# Patient Record
Sex: Male | Born: 1955 | Race: White | Hispanic: No | Marital: Married | Smoking: Former smoker
Health system: Southern US, Community
[De-identification: ages and names within clinical notes are randomized; demographics above are authoritative.]

## PROBLEM LIST (undated history)

## (undated) DIAGNOSIS — K219 Gastro-esophageal reflux disease without esophagitis: Secondary | ICD-10-CM

## (undated) DIAGNOSIS — S93699A Other sprain of unspecified foot, initial encounter: Secondary | ICD-10-CM

## (undated) DIAGNOSIS — M810 Age-related osteoporosis without current pathological fracture: Secondary | ICD-10-CM

## (undated) DIAGNOSIS — Z8739 Personal history of other diseases of the musculoskeletal system and connective tissue: Secondary | ICD-10-CM

## (undated) DIAGNOSIS — F1020 Alcohol dependence, uncomplicated: Secondary | ICD-10-CM

## (undated) DIAGNOSIS — G4730 Sleep apnea, unspecified: Secondary | ICD-10-CM

## (undated) DIAGNOSIS — K760 Fatty (change of) liver, not elsewhere classified: Secondary | ICD-10-CM

## (undated) DIAGNOSIS — H409 Unspecified glaucoma: Secondary | ICD-10-CM

## (undated) DIAGNOSIS — F419 Anxiety disorder, unspecified: Secondary | ICD-10-CM

## (undated) DIAGNOSIS — K8020 Calculus of gallbladder without cholecystitis without obstruction: Secondary | ICD-10-CM

## (undated) DIAGNOSIS — F329 Major depressive disorder, single episode, unspecified: Secondary | ICD-10-CM

## (undated) DIAGNOSIS — E669 Obesity, unspecified: Secondary | ICD-10-CM

## (undated) DIAGNOSIS — Z87828 Personal history of other (healed) physical injury and trauma: Secondary | ICD-10-CM

## (undated) DIAGNOSIS — Z8709 Personal history of other diseases of the respiratory system: Secondary | ICD-10-CM

## (undated) DIAGNOSIS — F32A Depression, unspecified: Secondary | ICD-10-CM

## (undated) HISTORY — DX: Obesity, unspecified: E66.9

## (undated) HISTORY — DX: Major depressive disorder, single episode, unspecified: F32.9

## (undated) HISTORY — DX: Sleep apnea, unspecified: G47.30

## (undated) HISTORY — DX: Age-related osteoporosis without current pathological fracture: M81.0

## (undated) HISTORY — DX: Personal history of other (healed) physical injury and trauma: Z87.828

## (undated) HISTORY — DX: Depression, unspecified: F32.A

## (undated) HISTORY — DX: Gastro-esophageal reflux disease without esophagitis: K21.9

## (undated) HISTORY — DX: Anxiety disorder, unspecified: F41.9

## (undated) HISTORY — DX: Other sprain of unspecified foot, initial encounter: S93.699A

## (undated) HISTORY — DX: Calculus of gallbladder without cholecystitis without obstruction: K80.20

## (undated) HISTORY — PX: SPINE SURGERY: SHX786

## (undated) HISTORY — PX: KNEE ARTHROSCOPY: SUR90

## (undated) HISTORY — DX: Personal history of other diseases of the respiratory system: Z87.09

## (undated) HISTORY — PX: VENTRAL HERNIA REPAIR: SHX424

## (undated) HISTORY — DX: Unspecified glaucoma: H40.9

## (undated) HISTORY — PX: OTHER SURGICAL HISTORY: SHX169

## (undated) HISTORY — DX: Personal history of other diseases of the musculoskeletal system and connective tissue: Z87.39

## (undated) HISTORY — DX: Alcohol dependence, uncomplicated: F10.20

---

## 1981-08-10 HISTORY — PX: EXPLORATORY LAPAROTOMY: SUR591

## 1990-08-10 HISTORY — PX: FOOT SURGERY: SHX648

## 2000-04-05 ENCOUNTER — Observation Stay (HOSPITAL_COMMUNITY): Admission: RE | Admit: 2000-04-05 | Discharge: 2000-04-06 | Payer: Self-pay | Admitting: Surgery

## 2000-08-10 HISTORY — PX: OTHER SURGICAL HISTORY: SHX169

## 2000-08-10 HISTORY — PX: ANTERIOR CERVICAL DISCECTOMY: SHX1160

## 2000-12-03 ENCOUNTER — Encounter: Payer: Self-pay | Admitting: Neurosurgery

## 2000-12-03 ENCOUNTER — Observation Stay (HOSPITAL_COMMUNITY): Admission: RE | Admit: 2000-12-03 | Discharge: 2000-12-04 | Payer: Self-pay | Admitting: Neurosurgery

## 2000-12-20 ENCOUNTER — Ambulatory Visit (HOSPITAL_COMMUNITY): Admission: RE | Admit: 2000-12-20 | Discharge: 2000-12-21 | Payer: Self-pay | Admitting: Neurosurgery

## 2000-12-20 ENCOUNTER — Encounter: Payer: Self-pay | Admitting: Neurosurgery

## 2000-12-22 ENCOUNTER — Inpatient Hospital Stay (HOSPITAL_COMMUNITY): Admission: AD | Admit: 2000-12-22 | Discharge: 2000-12-24 | Payer: Self-pay | Admitting: Neurosurgery

## 2000-12-22 ENCOUNTER — Encounter: Payer: Self-pay | Admitting: Neurosurgery

## 2001-01-17 ENCOUNTER — Encounter: Payer: Self-pay | Admitting: Neurosurgery

## 2001-01-17 ENCOUNTER — Encounter: Admission: RE | Admit: 2001-01-17 | Discharge: 2001-01-17 | Payer: Self-pay | Admitting: Neurosurgery

## 2001-03-03 ENCOUNTER — Encounter: Admission: RE | Admit: 2001-03-03 | Discharge: 2001-03-03 | Payer: Self-pay | Admitting: Neurosurgery

## 2001-03-03 ENCOUNTER — Encounter: Payer: Self-pay | Admitting: Neurosurgery

## 2004-07-02 ENCOUNTER — Ambulatory Visit (HOSPITAL_BASED_OUTPATIENT_CLINIC_OR_DEPARTMENT_OTHER): Admission: RE | Admit: 2004-07-02 | Discharge: 2004-07-02 | Payer: Self-pay | Admitting: Orthopedic Surgery

## 2004-09-08 ENCOUNTER — Ambulatory Visit: Payer: Self-pay | Admitting: Internal Medicine

## 2004-09-11 ENCOUNTER — Ambulatory Visit: Payer: Self-pay | Admitting: Internal Medicine

## 2006-09-06 ENCOUNTER — Ambulatory Visit: Payer: Self-pay | Admitting: Internal Medicine

## 2006-11-25 ENCOUNTER — Emergency Department (HOSPITAL_COMMUNITY): Admission: EM | Admit: 2006-11-25 | Discharge: 2006-11-26 | Payer: Self-pay | Admitting: Emergency Medicine

## 2007-04-21 ENCOUNTER — Ambulatory Visit: Payer: Self-pay | Admitting: Internal Medicine

## 2007-04-21 DIAGNOSIS — J209 Acute bronchitis, unspecified: Secondary | ICD-10-CM | POA: Insufficient documentation

## 2007-08-25 ENCOUNTER — Ambulatory Visit: Payer: Self-pay | Admitting: Internal Medicine

## 2007-08-25 DIAGNOSIS — J328 Other chronic sinusitis: Secondary | ICD-10-CM | POA: Insufficient documentation

## 2007-08-25 DIAGNOSIS — G4710 Hypersomnia, unspecified: Secondary | ICD-10-CM

## 2007-08-25 DIAGNOSIS — G4730 Sleep apnea, unspecified: Secondary | ICD-10-CM

## 2007-08-25 DIAGNOSIS — M12579 Traumatic arthropathy, unspecified ankle and foot: Secondary | ICD-10-CM | POA: Insufficient documentation

## 2007-09-27 ENCOUNTER — Telehealth: Payer: Self-pay | Admitting: Internal Medicine

## 2007-09-27 ENCOUNTER — Ambulatory Visit: Payer: Self-pay | Admitting: Internal Medicine

## 2007-09-27 DIAGNOSIS — H6120 Impacted cerumen, unspecified ear: Secondary | ICD-10-CM

## 2007-10-25 ENCOUNTER — Ambulatory Visit: Payer: Self-pay | Admitting: Internal Medicine

## 2007-10-25 DIAGNOSIS — L909 Atrophic disorder of skin, unspecified: Secondary | ICD-10-CM | POA: Insufficient documentation

## 2007-10-25 DIAGNOSIS — L919 Hypertrophic disorder of the skin, unspecified: Secondary | ICD-10-CM

## 2007-11-03 ENCOUNTER — Telehealth: Payer: Self-pay | Admitting: Internal Medicine

## 2007-11-03 DIAGNOSIS — H6690 Otitis media, unspecified, unspecified ear: Secondary | ICD-10-CM

## 2007-11-03 HISTORY — DX: Otitis media, unspecified, unspecified ear: H66.90

## 2008-02-01 ENCOUNTER — Telehealth: Payer: Self-pay | Admitting: Internal Medicine

## 2008-02-02 ENCOUNTER — Ambulatory Visit: Payer: Self-pay | Admitting: Internal Medicine

## 2008-02-02 DIAGNOSIS — K439 Ventral hernia without obstruction or gangrene: Secondary | ICD-10-CM | POA: Insufficient documentation

## 2008-02-06 DIAGNOSIS — K439 Ventral hernia without obstruction or gangrene: Secondary | ICD-10-CM

## 2008-02-06 HISTORY — DX: Ventral hernia without obstruction or gangrene: K43.9

## 2008-09-25 ENCOUNTER — Encounter: Payer: Self-pay | Admitting: Internal Medicine

## 2009-12-05 ENCOUNTER — Ambulatory Visit: Payer: Self-pay | Admitting: Family Medicine

## 2009-12-05 DIAGNOSIS — M1310 Monoarthritis, not elsewhere classified, unspecified site: Secondary | ICD-10-CM | POA: Insufficient documentation

## 2009-12-06 LAB — CONVERTED CEMR LAB
Basophils Absolute: 0 10*3/uL (ref 0.0–0.1)
Basophils Relative: 0.3 % (ref 0.0–3.0)
Eosinophils Absolute: 0.2 10*3/uL (ref 0.0–0.7)
Eosinophils Relative: 1.4 % (ref 0.0–5.0)
HCT: 41.1 % (ref 39.0–52.0)
Hemoglobin: 14.3 g/dL (ref 13.0–17.0)
Lymphocytes Relative: 29.5 % (ref 12.0–46.0)
Lymphs Abs: 3.3 10*3/uL (ref 0.7–4.0)
MCHC: 34.7 g/dL (ref 30.0–36.0)
MCV: 92.3 fL (ref 78.0–100.0)
Monocytes Absolute: 1.1 10*3/uL — ABNORMAL HIGH (ref 0.1–1.0)
Monocytes Relative: 9.6 % (ref 3.0–12.0)
Neutro Abs: 6.6 10*3/uL (ref 1.4–7.7)
Neutrophils Relative %: 59.2 % (ref 43.0–77.0)
Platelets: 275 10*3/uL (ref 150.0–400.0)
RBC: 4.45 M/uL (ref 4.22–5.81)
RDW: 12.6 % (ref 11.5–14.6)
Uric Acid, Serum: 7.8 mg/dL (ref 4.0–7.8)
WBC: 11.2 10*3/uL — ABNORMAL HIGH (ref 4.5–10.5)

## 2009-12-11 ENCOUNTER — Telehealth: Payer: Self-pay | Admitting: Internal Medicine

## 2009-12-25 ENCOUNTER — Ambulatory Visit: Payer: Self-pay | Admitting: Internal Medicine

## 2009-12-25 DIAGNOSIS — M1A9XX0 Chronic gout, unspecified, without tophus (tophi): Secondary | ICD-10-CM

## 2009-12-25 DIAGNOSIS — E790 Hyperuricemia without signs of inflammatory arthritis and tophaceous disease: Secondary | ICD-10-CM

## 2009-12-25 DIAGNOSIS — M1A00X0 Idiopathic chronic gout, unspecified site, without tophus (tophi): Secondary | ICD-10-CM

## 2009-12-25 HISTORY — DX: Chronic gout, unspecified, without tophus (tophi): M1A.9XX0

## 2009-12-25 HISTORY — DX: Idiopathic chronic gout, unspecified site, without tophus (tophi): M1A.00X0

## 2010-04-28 ENCOUNTER — Encounter: Payer: Self-pay | Admitting: Internal Medicine

## 2010-05-06 ENCOUNTER — Encounter: Payer: Self-pay | Admitting: Internal Medicine

## 2010-09-09 NOTE — Letter (Signed)
Summary: [facility]  [facility]   Imported By: Maryln Gottron 05/19/2010 13:07:48  _____________________________________________________________________  External Attachment:    Type:   Image     Comment:   External Document

## 2010-09-09 NOTE — Assessment & Plan Note (Signed)
Summary: severe pain in lft wrist/can not use hand/cjr   Vital Signs:  Patient profile:   55 year old male Temp:     98.6 degrees F oral BP sitting:   110 / 78  (left arm) Cuff size:   regular  Vitals Entered By: Sid Falcon LPN (December 05, 2009 2:36 PM) CC: severe left wrist pain, origin unknown   History of Present Illness: Acute visit for L wrist pain.     Several episodes of recurrent left wrist pain and swelling along with erythema and warmth. No injury.  Patient gives history that during the past year he had 2 episodes of pain and swelling left foot MTP joint. saw orthopedist and presumptive diagnosis of gout-no labs or arthrocentesis. Each time responded to prednisone. Not much response to Indocin. Denies any fever or chills. Right-hand dominant. No clear exacerbating features. Questionable history of gout in his father.  No fever or chills.  Allergies: 1)  ! Sulfamethoxazole (Sulfamethoxazole) 2)  ! Vicodin (Hydrocodone-Acetaminophen)  Review of Systems  The patient denies anorexia, fever, weight loss, chest pain, headaches, abdominal pain, melena, hematochezia, and severe indigestion/heartburn.    Physical Exam  General:  Well-developed,well-nourished,in no acute distress; alert,appropriate and cooperative throughout examination Lungs:  Normal respiratory effort, chest expands symmetrically. Lungs are clear to auscultation, no crackles or wheezes. Heart:  Normal rate and regular rhythm. S1 and S2 normal without gallop, murmur, click, rub or other extra sounds. Extremities:  patient has some swelling left wrist compared right dorsally with mild erythema and minimal warmth and minimal tenderness. Full range of motion. No significant effusion.   Impression & Recommendations:  Problem # 1:  MONOARTHRITIS (ICD-716.60)  recurrent monoarthritis involving left wrist and left MTP joint. Strongly suspect gout clinically.  No suspicion for infection.  Refill prednisone.  Obtain uric acid and follow up with primary to discuss possible prophylactic treatment after acute episode subsides.  Dietary issues discussed.  Orders: Venipuncture (95284) TLB-Uric Acid, Blood (84550-URIC) TLB-CBC Platelet - w/Differential (85025-CBCD)  Complete Medication List: 1)  Flonase 50 Mcg/act Susp (Fluticasone propionate) .... At bedtime 2)  Prednisone 10 Mg Tabs (Prednisone) .... Taper as follows:  6-5-4-4-3-3-2-1  Patient Instructions: 1)  Please schedule a follow-up appointment in 2 weeks with Dr Lovell Sheehan Prescriptions: PREDNISONE 10 MG TABS (PREDNISONE) taper as follows:  6-5-4-4-3-3-2-1  #28 x 0   Entered and Authorized by:   Evelena Peat MD   Signed by:   Evelena Peat MD on 12/05/2009   Method used:   Print then Give to Patient   RxID:   [id number]

## 2010-09-09 NOTE — Letter (Signed)
Summary: [facility]  [facility]   Imported By: Maryln Gottron 06/04/2010 13:56:15  _____________________________________________________________________  External Attachment:    Type:   Image     Comment:   External Document

## 2010-09-09 NOTE — Progress Notes (Signed)
Summary: gout meds?  Phone Note Call from Patient   Caller: Patient Call For: Stacie Glaze MD Reason for Call: Acute Illness Summary of Call: Pt is finishing up Prednisone and is afraid he may have another gout flare while he is out of town the next two weeks.  Does he need to have more meds just in case?  [phone] Initial call taken by: Lynann Beaver CMA,  Dec 11, 2009 10:09 AM  Follow-up for Phone Call        per dr Lovell Sheehan-  take 2 aleve two times a day at first sign or onset of gout Follow-up by: Willy Eddy, LPN,  Dec 12, 4538 3:55 PM  Additional Follow-up for Phone Call Additional follow up Details #1::        Patine informed.  He is to see Dr. Shela Commons in 2 weeks to fu on high uric acid per Dr. Caryl Never.  sCHEDULED 5-18. Additional Follow-up by: Rudy Jew, RN,  Dec 11, 2009 4:09 PM

## 2010-09-09 NOTE — Assessment & Plan Note (Signed)
Summary: FU URIC ACI BORDERLINE HIGH PER DR B/PS   Vital Signs:  Patient profile:   55 year old male Height:      70 inches Weight:      236 pounds BMI:     33.98 Temp:     98.2 degrees F oral Pulse rate:   76 / minute Resp:     14 per minute BP sitting:   112 / 80  (left arm)  Vitals Entered By: Willy Eddy, LPN (Dec 25, 2009 11:29 AM) CC: roa after seeing dr Caryl Never for gout- to [facility] and was put on indocin 75 bid-to tid prn gout flare up   CC:  roa after seeing dr Caryl Never for gout- to [facility] and was put on indocin 75 bid-to tid prn gout flare up.  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) 7)  Other Chronic Sinusitis  (ICD-473.8) 8)  Hypersomnia, Associated With Sleep Apnea  (ICD-780.53) 9)  Bronchitis, Viral  (ICD-466.0)  Current Problems (verified): 1)  Monoarthritis  (ICD-716.60) 2)  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 At Bedtime 2)  Prednisone 10 Mg Tabs (Prednisone) .... Taper As Follows:  6-5-4-4-3-3-2-1  Current Medications (verified): 1)   Indomethacin Cr 75 Mg Cr-Caps (Indomethacin) .Marland Kitchen.. 1 Two Times A Day To Three Times A Day As Needed Gout Flare Up  Allergies (verified): 1)  ! Sulfamethoxazole (Sulfamethoxazole) 2)  ! Vicodin (Hydrocodone-Acetaminophen)  Past History:  Social History: Last updated: 09/27/2007 Married Former Smoker  Risk Factors: Smoking Status: quit (12/25/2009)  Past medical, surgical, family and social histories (including risk factors) reviewed, and no changes noted (except as noted below).  Past Medical History: Reviewed history from 09/27/2007 and no changes required. took amoxil and allerx and infection improved but then it has recurred  Past Surgical History: Reviewed history from 02/02/2008 and no changes required. hernia repair  Family History: Reviewed history and no changes required.  Social History: Reviewed history from 09/27/2007 and no changes required. Married Former Smoker  Review of Systems  The patient denies anorexia, fever, weight loss, weight gain, vision loss, decreased hearing, hoarseness, chest pain, syncope, dyspnea on exertion, peripheral edema, prolonged cough, headaches, hemoptysis, abdominal pain, melena, hematochezia, severe indigestion/heartburn, hematuria, incontinence, genital sores, muscle weakness, suspicious skin lesions, transient blindness, difficulty walking, depression, unusual weight change, abnormal bleeding, enlarged lymph nodes, angioedema, and breast masses.    Physical Exam  General:  Well-developed,well-nourished,in no acute distress; alert,appropriate and cooperative throughout examination Lungs:  Normal respiratory effort, chest expands symmetrically. Lungs are clear to auscultation, no crackles  or wheezes. Heart:  Normal rate and regular rhythm. S1 and S2 normal without gallop, murmur, click, rub or other extra sounds. Extremities:  patient has some swelling left wrist compared right dorsally with mild erythema and minimal warmth and  minimal tenderness. Full range of motion. No significant effusion.   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   His updated medication list for this problem includes:    Allopurinol 300 Mg Tabs (Allopurinol) ..... One by mouth daily  Complete Medication List: 1)  Indomethacin Cr 75 Mg Cr-caps (Indomethacin) .Marland Kitchen.. 1 two times a day to three times a day as needed gout flare up 2)  Allopurinol 300 Mg Tabs (Allopurinol) .... One by mouth daily  Patient Instructions: 1)  if the allopurinol does not work then call back for the [facility] Prescriptions: INDOMETHACIN CR 75 MG CR-CAPS (INDOMETHACIN) 1 two times a day to three times a day as needed gout flare up  #30 x 1   Entered and Authorized by:   Stacie Glaze MD   Signed by:   Stacie Glaze MD on 12/25/2009   Method used:   Electronically to        [facility] (retail)       [address]       [place], [place]  [address]       Ph: [phone]       Fax: [phone]   RxID:   [id number] ALLOPURINOL 300 MG TABS (ALLOPURINOL) one by mouth daily  #30 x 11   Entered and Authorized by:   Stacie Glaze MD   Signed by:   Stacie Glaze MD on 12/25/2009   Method used:   Electronically to        [facility] (retail)       [address]       [place], [place]  [address]       Ph: [phone]       Fax: [phone]   RxID:   [id number]

## 2010-12-26 NOTE — H&P (Signed)
[place]. [facility]  Patient:    [name], [name]                     MRN: [id number] Adm. Date:  [id number] Attending:  Coletta Memos                         History and Physical  HISTORY:  [name] [name] is a 55 year old gentleman who was recently admitted for anterior cervical diskectomy and fusion at C5-6 for a right C6 radiculopathy.  [name] [name] did quite well after that operation and is now being admitted for a lumbar laminectomy and diskectomy at L4-5 on the right side.  CHIEF COMPLAINT:  Displaced disk L4-5 left.  INDICATIONS:  [name] [name] is a 55 year old gentleman who presented on 11/25/00 with pain in the neck and the lower back and left lower extremity.  The pain first started in the back and migrated down.  He feels weakness in the left leg.  He has not had any falls as a result of his loss of strength.  He has had no bowel or bladder dysfunction.  SOCIAL HISTORY:  He works as a Psychologist, occupational and is right-handed.  He does not smoke.  He drinks socially.  He does not use illicit drugs.  He is 510" tall and weighs 225 pounds.  PAST SURGICAL HISTORY:  Anterior cervical diskectomy and arthrodesis C5-6 with anterior plating, herniorrhaphy, right foot surgery.  PAST MEDICAL HISTORY:  Excellent.  ALLERGIES:  VICODIN AND SULFA.  FAMILY MEDICAL HISTORY:  The mother is age 52 and the father age 24.  The father has some respiratory disease and a bad hip.  REVIEW OF SYSTEMS:  Positive for sinus problems, hypercholesterolemia, left leg pain with walking, lower back pain, neck pain.  Denies constitutional, eye, gastrointestinal or genitourinary or respiratory, skin, neurological, psychiatric, endocrine, hematologic or allergic problems.  PHYSICAL EXAMINATION:  VITAL SIGNS:  Blood pressure 134/70, respiratory rate 14, pulse 67, afebrile.  GENERAL:  He is alert and oriented x 4 and answering all questions appropriately.  Memory, language, fund or knowledge  are normal.  Well kept in no distress.  The cervical incision is healing well.  There are no signs of infection.  Pupils are equal, round and reactive to light.  Full extraocular movements, flat optic disks, full visual fields.  Symmetrical facial sensation and movements.  Hearing is intact to finger rub bilaterally.  Uvula elevates in the midline.  The tongue protrudes in the midline.  Shoulder shrug is normal.  There is 5/5 strength in the upper and lower extremities.  Negative Patricks maneuver.  No Hoffmans sign and no clonus.  Toes downgoing on plantar stimulation.  Muscle tone, bulk and coordination were normal. Negative Rombergs test.  He can toe walk, heel walk and do deep knee bends without difficulty.  Intact proprioception and pinprick.  There are 2+ reflexes at the biceps, triceps, brachioradialis, knees and ankles.  LUNGS:  Lung fields are clear.  HEART:  Regular rate and rhythm with no murmurs, rubs.  Pulses are good at the wrists and feet bilaterally.  MRI of the lumbar spine showed a displaced disk at L4-5 with degenerative changes and modus changes there.  Fragment extends behind the body of L5 causing compression of the left L5 nerve root.  DIAGNOSIS: Displaced disk L4-5, right L5 radiculopathy.  [name] will be admitted for a lumbar diskectomy.  We already discussed the risks of the  procedure including bleeding, infection, no pain relief, recurrent disk, need for further surgery and possible fusion.  He agrees and wishes to proceed. DD:  12/20/00 TD:  12/20/00 Job: [id number]

## 2010-12-26 NOTE — Op Note (Signed)
NAMEMINDY, BEHNKEN                 ACCOUNT NO.:  [id number]   MEDICAL RECORD NO.:  [id number]          PATIENT TYPE:  AMB   LOCATION:  DSC                          FACILITY:  [facility]   PHYSICIAN:  Feliberto Gottron. Turner Daniels, M.D.   DATE OF BIRTH:  06-Feb-1956   DATE OF PROCEDURE:  07/02/2004  DATE OF DISCHARGE:                                 OPERATIVE REPORT   PREOPERATIVE DIAGNOSIS:  Right knee lateral meniscal tear.   POSTOPERATIVE DIAGNOSES:  1.  Right knee lateral meniscal tear.  2.  Chondromalacia of patella, focal grade 3, and cartilaginous loose      bodies.   PROCEDURES:  Right knee arthroscopic partial lateral meniscectomy,  debridement of chondromalacia from the patella grade 3 with flap tears and  removal of loose bodies.   SURGEON:  Feliberto Gottron. Turner Daniels, M.D.   FIRST ASSISTANT:  Erskine Squibb B. Jannet Mantis.   ANESTHETIC:  Local with IV sedation.   ESTIMATED BLOOD LOSS:  Minimal.   FLUID REPLACEMENT:  800 mL of crystalloid.   DRAINS PLACED:  None.   TOURNIQUET TIME:  None.   INDICATIONS FOR PROCEDURE:  A 55 year old gentleman injured on the job with  an MRI-proven lateral meniscal tear, displaced.  Symptomatic for a number of  months.  Desires elective removal of same.  Arthroscopic evaluation and  treatment of the knee.  Well aware of the risks and benefits of surgery.   DESCRIPTION OF PROCEDURE:  Patient identified by armband and taken to the  operating room at the [facility].  Appropriate anesthetic  monitors were attached.  Local anesthetic with IV sedation induced into the  right lower extremity, which was then prepped and draped in the usual  sterile fashion from the ankle to the mid thigh.  Lateral posts applied to  the table.  We began the procedure by making standard inferomedial and  inferolateral parapatellar portals, allowing introduction of the arthroscope  through the inferolateral portal and the outflow through the inferomedial  portal.  Diagnostic  arthroscopy revealed focal grade chondromalacia to the  apex of the patella, which required debridement with a 3.5 gator sucker  shaver back to a smooth set of margins.  Multiple cartilaginous loose bodies  were found in the joint fluid and removed through the outflow or with the  gator sucker shaver.  Moving into the medial compartment the meniscus and  articular cartilages were in good condition.  Moving into the notch we  identified a free fragment of lateral meniscus which was removed, as well as  a large lateral meniscal tear that appeared to be a bucket handle tear that  had torn its anterior attachment.  This was removed with the pituitary  rongeurs, as well as a 4.2 great white sucker shaver, clearing the notch.  Moving to the lateral compartment, we trimmed off some more torn cartilage  from the lateral meniscus.  About half of the external rim was intact and  the articular cartilages were in relatively good condition.  The gutters  were cleared medially and laterally.  The arthroscope was taken medial  and  lateral to the PCL, clearing the posterior compartments.  At this point, the  knee was irrigated out with normal saline solution.  The arthroscopic  instruments were removed and a dressing of Xeroform 4 x 4 dressing, sponges,  Webril and an ACE wrap applied.  The patient was then awaken and taken to  the recovery room without difficulty.      Emilio Aspen  D:  07/02/2004  T:  07/02/2004  Job:  [id number]

## 2010-12-26 NOTE — H&P (Signed)
[place]. [facility]  Patient:    [name] [name], [name] [name]                     MRN: [id number] Adm. Date:  [id number] Attending:  Coletta Memos                         History and Physical  CHIEF COMPLAINT/HISTORY OF PRESENT ILLNESS: [name] is a gentleman whom I just performed lumbar surgery on on Dec 20, 2000.  He had an uncomplicated procedure at L4-5 which was causing a left L5 radiculopathy.  He had a large free fragment.  During this procedure no problems were encountered.  The large fragment was removed and the disk space was emptied out of loose material. The nerve root was freed as inspected by myself at the time of surgery. Postoperatively he still had occasional pains going down his leg but that is not at all unusual after lumbar disk herniation and surgery for it.  He was discharged home on Dec 21, 2000.  He called this morning stating that he was having severe pain in his left lower extremity.  He was almost in tears.  For that reason, and due to the fact that I would be tied in the operating room, I had him come back in for admission.  PAST MEDICAL HISTORY, REVIEW OF SYSTEMS, and SOCIAL HISTORY: Unchanged from his History and Physical on Dec 20, 2000.  PHYSICAL EXAMINATION: On examination he has 5/5 strength in the lower extremities.  He has negative Patricks maneuver, negative Homans sign.  No inflammation or swelling of the lower extremities is identified.  LABORATORY DATA: He had an MRI done today with and without contrast and it does not show any retained disk fragments, and does not show any recurrent disk herniation.  It shows the expected fluid and blood surrounding the sac but there is no large hematoma.  IMPRESSION/PLAN: [name] is having pain.  The pain is not constant but only when he is making a transition; for example, from sitting to standing or from lying to sitting.  It is unusual in essence because it is not constant.  I will  keep him on pain medication and started him on some steroids, and see what a little more time will do. DD:  12/22/00 TD:  12/23/00 Job: [id number]

## 2010-12-26 NOTE — H&P (Signed)
[place]. [facility]  Patient:    [name], [name]                     MRN: [id number] Adm. Date:  [id number] Disc. Date: [id number] Attending:  Coletta Memos                         History and Physical  ADMITTING DIAGNOSES: 1. Cervical spondylosis with _________ , C5-6. 2. Lumbar displaced disk, L4-5. 3. Degenerative disk disease, L4-5. 4. Cervical radiculopathy. 5. Lumbar radiculopathy.  INDICATIONS:  [name] [name] is a 55 year old gentleman who presented to my office on November 25, 2000, for evaluation of pain in the back and left lower extremity and pain in the right arm.  The pain in his back he has had for approximately a year.  He has had arm pain for approximately two months.  The pain in his arm is actually becoming his biggest problem and he would not have said that a few months ago.  He has never had pain like this before in his neck or in his arm.  He describes it as radiating down the arm and into the hand.  The first few digits of the hand had numbness and tingling.  The left leg also has numbness and tingling.  Pain radiates from the buttocks into the posterior thigh along the side of the cath into the lateral malleolus.  It started in his back, then migrated down.  He feels weakness in his left lower extremity and in his right upper extremity, but he has not had any falls, nor has he dropped anything as a result of strength.  He says if he picks something up and turns his head the wrong way, he gets a shooting pain down his arm and he has dropped objects because of that.  He has had no bowel or bladder dysfunction.  SOCIAL HISTORY:  [name] [name] works as a Psychologist, occupational and is right-handed.  He does not smoke.  He does drink socially.  He does not use illicit drugs.  He is 5 feet 10 inches tall and weighs approximately 225 pounds.  PAST MEDICAL HISTORY:  He has undergone a herniorrhaphy and right foot surgery.  Past medical history is otherwise  excellent.  ALLERGIES:  He has an allergy to VICODIN causing itching and SULFA MEDICATIONS.  MEDICATIONS: 1. He currently takes Vioxx 15 mg per day. 2. Muscle relaxant. 3. Tri-Chlor for cholesterol.  FAMILY MEDICAL HISTORY:  Good.  Mother is 63, father is 22.  Father has respiratory disease and a bad hip.  REVIEW OF SYSTEMS:  Father does have sinus problems, hypercholesterolemia, leg cramps with walking, left arm weakness, lower back pain, right arm pain, left lower extremity pain, neck pain.  He denies constitutional, eye, gastrointestinal, genitourinary, respiratory, skin, neurological, psychiatric, endocrine, hematologic, or allergic problems.  PHYSICAL EXAMINATION:  VITAL SIGNS:  He has a pulse of 60.  GENERAL:  He is alert, oriented x 4, and answering all questions appropriately.  _________ , attention span, and fund of knowledge are normal. He is well kept and in no distress.  NEUROLOGIC:  Pupils equal, round, and reactive to light.  He has full extraocular movements and flat optic disks.  Visual fields are full.  Facial sensation and facial movements are symmetric and normal.  Hearing intact to finger rub bilaterally.  Uvula elevates in the midline.  Tongue protrudes in the midline.  Shoulder shrug is  normal.  Strength is 5/5 in the upper and lower extremities bilaterally.  No weakness detected in the biceps on the right side, wrist flexors, wrist extensors, or triceps.  He has negative straight leg raising bilaterally.  Negative Patricks maneuver.  No Hoffmans sign nor clonus.  Toes are downgoing to plantar stimulation.  Muscle tone, bulk, and coordination are normal.  Rombergs test is negative.  He can toe walk, heel walk, and do deep knee bends without difficulty.  Intact proprioception and pinprick.  Deep tendon reflexes 2+ at the biceps, triceps, brachial radialis, knees, and ankles.  Right biceps slightly less brisk and that is the left, as is the brachial  radialis on the right compared to the left.  There are no cervical masses or bruits.  LUNGS:  Lung fields are clear.  HEART:  Regular rate and rhythm, no murmurs or rubs.  EXTREMITIES:  Pulses are good at the wrists and feet bilaterally.  LABORATORY:  MRI of the cervical spine done in an open scanner shows a C5-6, _________ C5, mainly on the right side.  Lumbar spine shows the disk at L4-5 and degenerative changes at L4-5.  DIAGNOSES: 1. Cervical spondylosis without myelopathy. 2. Displaced disk, L4-5. 3. Degenerative disk disease, L4-5. 4. Cervical radiculopathy. 5. Lumbar radiculopathy.  SUMMARY:  Lumbar spine films also show a large disk at L4-5 with degenerative changes and _________ changes present.  Clonus is normal.  There is a free fragment of disk extending behind the body of L5 compressing the left L5 nerve root.  [name] [name] has two problems, both of which are causing him a great deal of discomfort.  These are herniated disk at L4-5 on the left and large _________ C5-6 closing foraminal stenosis and a right C6 radiculopathy.  I believe he will respond best to operative therapy.  We discussed cervical traction for his neck, but that is not something he is interested in.  He would like to proceed.  We will first go ahead with the cervical procedure. Risks of the procedure including bleeding, infection, no pain relief, bowel or bladder dysfunction, paralysis, arm weakness, leg weakness, fusion failure, heart failure, no improvement in pain were discussed.  He understands and wishes to proceed. DD:  12/03/00 TD:  12/04/00 Job: [id number]

## 2010-12-26 NOTE — Op Note (Signed)
[facility]  Patient:    [name] [name], [name] [name]                     MRN: [id number] Proc. Date: 04/05/00 Adm. Date:  [id number] Disc. Date: [id number] Attending:  Charlton Haws CC:         Stacie Glaze, M.D. [facility]   Operative Report  OFFICE RECORD NUMBER:  [id number]  PREOPERATIVE DIAGNOSIS:  Chronic incarcerated ventral incisional hernia.  POSTOPERATIVE DIAGNOSIS:  Chronic incarcerated ventral incisional hernia.  OPERATION:  Repair of ventral incisional hernia.  CLINICAL HISTORY:  The patient is a 55 year old who was recently found to have an area of pain in an area that had some incarcerated omentum and a ventral incisional hernia in the lower portion of his upper midline incision.  DESCRIPTION OF PROCEDURE:  The patient was seen in the holding area, and the palpable area marked.  He was taken to the operating room where a general anesthesia was obtained, and the abdomen was prepped and draped.  I made a vertical incision to excise the old prior midline scar from just above the umbilicus, going up for about 3-4 inches.  Subcutaneous tissues were divided. I entered a hernia sac which had omentum in it and was protruding to the left of the midline.  The hernia sac was cleaned out of its contents and the omentum cleaned off of the fascia so that it would reduce easily.  I debrided the small hernia sac from the subcutaneous.  I then used retractors to lift up the full thickness skin and subcutaneous tissues off the fascia for a distance of a couple of inches in all directions around the defect.  The defect was just barely big enough to allow my finger to enter the abdominal cavity.  There was a tiny defect just below the first one that was about 2 mm and had a tiny piece of omentum coming through it.  No other fascial defects could be identified either by palpation or visualization.  I then closed the main defect with three sutures of 0 Prolene,  leaving the two end ones tied but uncut to thread through the mesh.  Smaller lower defect was closed with a single 0 Prolene which again was left tied but uncut.  A piece of 3 x 6 inch Atrium mesh was then threaded through the sutures and the sutures tied down, thus holding it down to the repair.  I then put several other sutures into the fascia circumferentially so that the piece of mesh fit the entire area that was raised up going well above, below, medial, and lateral in either direction from the midline laterally to hold the mesh down. It was trimmed to fit.  The area was checked for hemostasis and appeared to be dry.  I injected 0.25% Marcaine into the fascia and then subcutaneously for postoperative analgesia.  The subcutaneous was closed with some interrupted 2-0 Vicryl and the skin with staples.  The patient tolerated the procedure well.  There were no operative complications.  All counts were correct. DD:  04/05/00 TD:  04/05/00 Job: [id number]

## 2010-12-26 NOTE — Op Note (Signed)
[place]. [facility]  Patient:    [name] [name], [name] [name]                     MRN: [id number] Proc. Date: 12/20/00 Adm. Date:  [id number] Attending:  Coletta Memos                           Operative Report  PREOPERATIVE DIAGNOSIS:  Displaced disk L4-5 right, L5 radiculopathy right.  POSTOPERATIVE DIAGNOSIS:  Displaced disk L4-5 right, L5 radiculopathy right.  OPERATION PERFORMED:  Right L4-5 semihemilaminectomy, diskectomy with microdissection.  SURGEON:  Kyle L. Franky Macho, M.D.  ASSISTANT:  Danae Orleans. Venetia Maxon, M.D.  ANESTHESIA:  General endotracheal.  INDICATIONS FOR PROCEDURE:  [name] [name] is a 55 year old gentleman whom I just took to the operating room for a C6 radiculopathy on the right side.  He underwent an ACDF.  We decided to do the procedures in this order.  I therefore felt it was better to have his neck procedure first.  He is now coming in for lumbar disk surgery due to a displaced disk at L4-5 and a history of left lower extremity pain.  DESCRIPTION OF PROCEDURE:  The patient was brought to the operating room intubated and placed under general anesthesia.  I shaved his back, prepped it and placed spinal needle sterilely at where I believed L4-5 was.  X-ray showed L4-5, the needle was pointed between the L4 and 5 spinous processes.  I then opened the incision after infiltrating 10 cc of 0.5% lidocaine 1:200,000 strength epinephrine and draping the patient.  Incision was done with a #10 blade and taken down to the thoracolumbar fascia.   I then reflected the paraspinous musculature to expose the lamina of L4 and 5.  I took another x-ray and it showed I was at L4-5.  I used the high speed air drill to perform a semihemilaminectomy of L4.  I removed the ligamentum flavum between the lamina of L4 and L5.  The thecal sac was now exposed.  I brought the microscope into the operative field.  I retracted the thecal sac medially and opened the disk space  after cauterizing some overlying veins.  I with the assistance of Dr. Venetia Maxon then removed the disk in a progressive fashion using Epstein curets, pituitary rongeurs and nerve hooks.  I was able to pull out three very large fragments, one which was obviously caudal to the disk space, the others from the disk space itself.  It was quite degenerated and a significant amount of disk material was removed.  A removed as much as I thought was needed to decompressed the nerve root and that being done, the nerve root was inspected by myself and Dr. Venetia Maxon.  The microscope was then removed as microdissection was now complete.  The wound was irrigated.  I then closed the wound in layered fashion using Vicryl sutures.  A sterile dressing was Dermabond.  The patient tolerated the procedure without difficulty. DD:  12/20/00 TD:  12/20/00 Job: [id number]

## 2010-12-26 NOTE — Op Note (Signed)
[place]. [facility]  Patient:    [name] [name], [name] [name]                     MRN: [id number] Proc. Date: 12/03/00 Adm. Date:  [id number] Disc. Date: [id number] Attending:  Coletta Memos                           Operative Report  PREOPERATIVE DIAGNOSIS:  Cervical spondylosis, C5-6 and right C6 radiculopathy.  POSTOPERATIVE DIAGNOSIS:  Cervical spondylosis, C5-6 and right C6 radiculopathy.  PROCEDURE:  Anterior cervical diskectomy, C5-6, arthrodesis of C5-6 with anterior plating and 7 mm allograft, small statured Synthes plate using 16 mm in length.  COMPLICATIONS:  None.  SURGEON:  Kyle L. Franky Macho, M.D.  ASSISTANT:  Stefani Dama, M.D.  INDICATIONS:  The patient is a 55 year old gentleman with cervical spondylosis at C5-6.  He had significant pain in the right upper extremity.  He has agreed to undergo an anterior cervical diskectomy at C5-6 for relief of the pressure on the right C6 nerve root.  DESCRIPTION OF PROCEDURE:  The patient was brought to the operating room, intubated, and placed under general anesthesia without difficulty.  His neck was prepped and he was draped in a sterile fashion.  I infiltrated 4 cc of 0.5% lidocaine, 1:200,000 strength epinephrine into the proposed incision site, starting from the midline of the neck and extending to the medial border of the sternocleidomastoid just under the thyroid cartilage.  I opened the skin incision with a #10 blade and took this down through the subcutaneous tissue to the platysma.  Dissected above the platysma with Metzenbaum scissors.  I opened the platysma in a horizontal fashion using Metzenbaum scissors.  I dissected underneath the platysma with Metzenbaum scissors.  I was then able to determine the medial strap of the sternocleidomastoid.  I via both sharp and blunt dissection created a channel, a passage way between the sternocleidomastoid and the medial strap muscles.  I also took the  omohyoid medially.  I exposed the anterior cervical spine, and then used a Kidner to remove soft tissue.  I placed a spinal needle and it showed that I was at C5-6.  I then reflected the longus colli muscles laterally using monopolar cautery.  I placed a self-retaining retractor.  I opened the disk spaces with a #15 blade and removed some disk material at C5-6.  I then placed distraction pins at C5, one at C6, and opened the disk space, and distracted the disk space.  I brought the microscope into the operative field and proceeded to complete my diskectomy.  I then exposed the posterior longitudinal ligament which I was able to open with a Kerrison punch quite easily.  I removed that and decompressed the C6 nerve roots bilaterally.  I was able to achieve very good decompression of the right C6 nerve root which was well-seen after the decompression.  Controlled some bleeding with Gelfoam and then removed that. I then placed a 7 mm iliac crest graft into the disk space.  I removed the distraction and the pins.  The patient initially had been placed on 10 pounds of cervical traction via chin strap.  I had that weight removed.  I then placed a 16 mm small statured Synthes platge.  Put in a screw first in C5 and then in C6.  Then, the other two screws, one at C5 and one at C6.  Locking screws were  placed.  Dr. Danielle Dess assisted during the diskectomy and plate placement.  I took another x-ray that showed the plate and bone plug to be in good position.  Then, I reirrigated.  I then reapproximated the platysma with Vicryl sutures.  I then reapproximated subcutaneous tissues with Vicryl sutures.  Dermabond was used for skin.  The patient tolerated the procedure well. DD:  12/03/00 TD:  12/05/00 Job: [id number]

## 2012-02-22 ENCOUNTER — Ambulatory Visit (INDEPENDENT_AMBULATORY_CARE_PROVIDER_SITE_OTHER): Payer: Self-pay | Admitting: Internal Medicine

## 2012-02-22 ENCOUNTER — Encounter: Payer: Self-pay | Admitting: Internal Medicine

## 2012-02-22 VITALS — BP 124/84 | HR 72 | Temp 98.2°F | Resp 16 | Ht 70.0 in | Wt 238.0 lb

## 2012-02-22 DIAGNOSIS — Z0000 Encounter for general adult medical examination without abnormal findings: Secondary | ICD-10-CM

## 2012-02-22 DIAGNOSIS — M1310 Monoarthritis, not elsewhere classified, unspecified site: Secondary | ICD-10-CM

## 2012-02-22 DIAGNOSIS — G5790 Unspecified mononeuropathy of unspecified lower limb: Secondary | ICD-10-CM | POA: Insufficient documentation

## 2012-02-22 DIAGNOSIS — M130 Polyarthritis, unspecified: Secondary | ICD-10-CM

## 2012-02-22 DIAGNOSIS — R0789 Other chest pain: Secondary | ICD-10-CM

## 2012-02-22 DIAGNOSIS — R071 Chest pain on breathing: Secondary | ICD-10-CM

## 2012-02-22 DIAGNOSIS — R7989 Other specified abnormal findings of blood chemistry: Secondary | ICD-10-CM

## 2012-02-22 LAB — SEDIMENTATION RATE: Sed Rate: 6 mm/hr (ref 0–22)

## 2012-02-22 MED ORDER — PREGABALIN 75 MG PO CAPS
75.0000 mg | ORAL_CAPSULE | Freq: Three times a day (TID) | ORAL | Status: DC
Start: 2012-02-22 — End: 2013-01-05

## 2012-02-22 MED ORDER — ASPIRIN EC 81 MG PO TBEC: 81.0000 mg | DELAYED_RELEASE_TABLET | Freq: Every day | ORAL | Status: AC

## 2012-02-22 NOTE — Patient Instructions (Addendum)
The patient is instructed to continue all medications as prescribed. Schedule followup with check out clerk upon leaving the clinic  

## 2012-02-22 NOTE — Progress Notes (Signed)
  Subjective:    Patient ID: [name] [name], male    DOB: 01/18/1956, 56 y.o.   MRN: [id number]  HPI Patient history of trauma to the right foot and has developed subsequently a gait disorder and 8 polyarticular arthritic picture.  He has pain involving multiple joints and expresses that he has pain involving the opposite side of the body primarily in the left hip low back as increasing pain in his chest and arms with activity He does not have any family hx of CAD and denies nausea and or diaphoresis  The pt has been followed by Dr Fabiola Backer for neck and back disc  Surgery MRI  By Dr Fabiola Backer  Of [facility] spine 2012  Was read as stable   Review of Systems  Constitutional: Positive for fever, diaphoresis and fatigue.  HENT: Negative for hearing loss, congestion, neck pain and postnasal drip.   Eyes: Negative for discharge, redness and visual disturbance.  Respiratory: Negative for cough, shortness of breath and wheezing.   Cardiovascular: Negative for leg swelling.  Gastrointestinal: Negative for abdominal pain, constipation and abdominal distention.  Genitourinary: Negative for urgency and frequency.  Musculoskeletal: Positive for back pain, joint swelling and gait problem. Negative for arthralgias.  Skin: Negative for color change and rash.  Neurological: Negative for weakness and light-headedness.  Hematological: Negative for adenopathy.  Psychiatric/Behavioral: Positive for decreased concentration. Negative for behavioral problems.       Objective:   Physical Exam  Nursing note and vitals reviewed. Neck:       Post surgical change  Abdominal: He exhibits distension. There is tenderness.  Musculoskeletal: He exhibits edema and tenderness.  Neurological:       Muscle wasting of proximal muscles in legs  Psychiatric:       Mild short term memory effect          Assessment & Plan:  Patient has multiple arthritic joint pains.  He has had multiple surgeries including cervical fusion  and lumbar fusion and he has had arthroscopy of his knee right knee. He now presents with multiple joint pain and gait issues occasionally his pain is severe enough that he has to use crutches to ambulate.  He has been to a neurosurgeon who has evaluated his cervical and lumbar fusions the MRI he has been to a foot orthopedist who suggested further surgery for his right foot.  He has extreme discomfort in his left hip low back and foot on the right. He reports multiple falls   We are going to obtain basic screening labs to make sure that this is not rheumatic in etiology but I suspect that this is due to the multiple nerve damages that occurred during the surgical procedures and the resulting disease processes that created the need for surgery.  I believe that he would benefit from Lyrica 50 mg by mouth twice a day is initial dose to see if we could control some of his neuropathic pain.

## 2012-02-23 LAB — RHEUMATOID FACTOR: Rhuematoid fact SerPl-aCnc: <10 IU/mL (ref <=14)

## 2012-02-26 ENCOUNTER — Ambulatory Visit (INDEPENDENT_AMBULATORY_CARE_PROVIDER_SITE_OTHER)
Admission: RE | Admit: 2012-02-26 | Discharge: 2012-02-26 | Disposition: A | Discharge status: Home / Self Care | Payer: Self-pay | Source: Ambulatory Visit | Attending: Internal Medicine | Admitting: Internal Medicine

## 2012-02-26 DIAGNOSIS — R071 Chest pain on breathing: Secondary | ICD-10-CM

## 2012-02-26 DIAGNOSIS — R0789 Other chest pain: Secondary | ICD-10-CM

## 2012-06-30 ENCOUNTER — Telehealth: Payer: Self-pay | Admitting: Internal Medicine

## 2012-06-30 ENCOUNTER — Ambulatory Visit (INDEPENDENT_AMBULATORY_CARE_PROVIDER_SITE_OTHER): Payer: Self-pay | Admitting: Family Medicine

## 2012-06-30 ENCOUNTER — Encounter: Payer: Self-pay | Admitting: Family Medicine

## 2012-06-30 VITALS — BP 142/88 | HR 77 | Temp 98.1°F | Wt 238.0 lb

## 2012-06-30 DIAGNOSIS — M546 Pain in thoracic spine: Secondary | ICD-10-CM

## 2012-06-30 DIAGNOSIS — R1013 Epigastric pain: Secondary | ICD-10-CM

## 2012-06-30 LAB — HEPATIC FUNCTION PANEL
AST: 25 U/L (ref 0–37)
Albumin: 4.6 g/dL (ref 3.5–5.2)
Total Protein: 7.7 g/dL (ref 6.0–8.3)

## 2012-06-30 LAB — LIPASE: Lipase: 21 U/L (ref 11.0–59.0)

## 2012-06-30 LAB — CBC WITH DIFFERENTIAL/PLATELET
Basophils Absolute: 0 10*3/uL (ref 0.0–0.1)
Eosinophils Absolute: 0.1 10*3/uL (ref 0.0–0.7)
Hemoglobin: 15.1 g/dL (ref 13.0–17.0)
Lymphocytes Relative: 29.7 % (ref 12.0–46.0)
Monocytes Relative: 7.3 % (ref 3.0–12.0)
Neutro Abs: 4.8 10*3/uL (ref 1.4–7.7)
Neutrophils Relative %: 61 % (ref 43.0–77.0)
Platelets: 255 10*3/uL (ref 150.0–400.0)
RDW: 12.2 % (ref 11.5–14.6)

## 2012-06-30 LAB — BASIC METABOLIC PANEL
BUN: 12 mg/dL (ref 6–23)
Calcium: 9.5 mg/dL (ref 8.4–10.5)
GFR: 114.81 mL/min (ref >60.00)
Glucose, Bld: 110 mg/dL — ABNORMAL HIGH (ref 70–99)

## 2012-06-30 LAB — POCT URINALYSIS DIPSTICK
Bilirubin, UA: NEGATIVE
Glucose, UA: NEGATIVE
Leukocytes, UA: NEGATIVE
Nitrite, UA: NEGATIVE
Urobilinogen, UA: 0.2
pH, UA: 7

## 2012-06-30 LAB — AMYLASE: Amylase: 66 U/L (ref 27–131)

## 2012-06-30 NOTE — Telephone Encounter (Signed)
Patient Information:  Caller Name: [name] [name]  Phone: [phone]  Patient: [name] [name]  Gender: Male  DOB: 04/18/1956  Age: 56 Years  PCP: Darryll Capers (Adults only)   Symptoms  Reason For Call & Symptoms: Right shoulder blade pain; upper gastric symptoms - feels like he's starving, then he'll eat and have instense gas pains; reflux sxs.  Reviewed Health History In EMR: Yes  Reviewed Medications In EMR: Yes  Reviewed Allergies In EMR: Yes  Date of Onset of Symptoms: Unknown  Guideline(s) Used:  Back Pain  Disposition Per Guideline:   Go to ED Now (or to Office with PCP Approval)  Reason For Disposition Reached:   Patient sounds very sick or weak to the triager  Advice Given:  Call Back If:  You become worse.  Office Follow Up:  Does the office need to follow up with this patient?: No  Instructions For The Office: N/A  Appointment Scheduled:  06/30/2012 10:45:00

## 2012-06-30 NOTE — Progress Notes (Signed)
  Subjective:    Patient ID: [name] [name], male    DOB: [birth year], 56 y.o.   MRN: [id number]  HPI Here for chronic back and abdominal pains that have been going on for 6 months. One set of pains are sharp, sometimes severe pains in the back just under the right shoulder blade. These occur several times a week, and they may last from 10 minutes to several hours at a time. They are not associated with nausea or SOB or cough or fever. He also gets epigastric pains several times a week which are not as severe and are more crampy in nature. They also last several hours at a time. He gets nausea and bloating and belching with these pains but no fever. His BMs and urinations are regular. Of note he has had multiple abdominal surgeries over the years.    Review of Systems  Constitutional: Negative.   Respiratory: Negative.   Cardiovascular: Negative.   Gastrointestinal: Positive for nausea, abdominal pain and abdominal distention. Negative for vomiting, diarrhea, constipation, blood in stool and rectal pain.  Genitourinary: Negative.   Musculoskeletal: Positive for back pain.       Objective:   Physical Exam  Constitutional: He appears well-developed and well-nourished.  Cardiovascular: Normal rate, regular rhythm, normal heart sounds and intact distal pulses.   Pulmonary/Chest: Effort normal and breath sounds normal. No respiratory distress. He has no wheezes. He has no rales. He exhibits no tenderness.  Abdominal: Soft. Bowel sounds are normal. He exhibits no distension and no mass. There is no tenderness. There is no rebound and no guarding.          Assessment & Plan:  One possibility that could tie all these symptoms together is gall bladder disease. We will get labs and set up an abdominal US soon.

## 2012-07-04 ENCOUNTER — Ambulatory Visit (INDEPENDENT_AMBULATORY_CARE_PROVIDER_SITE_OTHER): Payer: No Typology Code available for payment source | Admitting: Internal Medicine

## 2012-07-04 ENCOUNTER — Ambulatory Visit
Admission: RE | Admit: 2012-07-04 | Discharge: 2012-07-04 | Disposition: A | Discharge status: Home / Self Care | Payer: No Typology Code available for payment source | Source: Ambulatory Visit | Attending: Family Medicine | Admitting: Family Medicine

## 2012-07-04 ENCOUNTER — Encounter: Payer: Self-pay | Admitting: Internal Medicine

## 2012-07-04 VITALS — BP 150/90 | HR 84 | Temp 97.9°F | Resp 18 | Wt 242.0 lb

## 2012-07-04 DIAGNOSIS — M12579 Traumatic arthropathy, unspecified ankle and foot: Secondary | ICD-10-CM

## 2012-07-04 DIAGNOSIS — R1013 Epigastric pain: Secondary | ICD-10-CM

## 2012-07-04 DIAGNOSIS — G5790 Unspecified mononeuropathy of unspecified lower limb: Secondary | ICD-10-CM

## 2012-07-04 NOTE — Patient Instructions (Signed)
Please consult with your legal representative and determine what additional information this office may provide  Followup with Dr. Lovell Sheehan  as scheduled

## 2012-07-04 NOTE — Progress Notes (Signed)
  Subjective:    Patient ID: [name] [name], male    DOB: [birth year], 56 y.o.   MRN: [id number]  HPI  56 year old patient who is in the process of applying for disability benefits.  He has a history of traumatic arthritis involving the right foot and apparently also a neuropathic process involving the foot. He states that the he requires crutches in the morning and also later in the afternoon. He has a history of both cervical and lumbar spine disease and has had the laminectomies performed at both locations. He also has had bilateral knee pain and arthroscopic surgery.  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 Many days he is unable to work at all.  The patient has obtained legal assistance.  Past Medical History  Diagnosis Date  . Hx of gout   . Hx of sinusitis   . Inflammation of foot due to trauma     History   Social History  . Marital Status: Married    Spouse Name: N/A    Number of Children: N/A  . Years of Education: N/A   Occupational History  . Not on file.   Social History Main Topics  . Smoking status: Former Games developer  . Smokeless tobacco: Never Used  . Alcohol Use: 0.5 oz/week    1 drink(s) per week  . Drug Use: No  . Sexually Active: Yes   Other Topics Concern  . Not on file   Social History Narrative  . No narrative on file    Past Surgical History  Procedure Date  . Hernia repair   . Foot surgery 1992  . Neck surgery 2005  . Back surgery 2005  . Knee arthroscopy 2003  . Fusion c6-7     dr Fabiola Backer  . Spine surgery   . Lumber fusion     dr cabel    No family history on file.  Allergies  Allergen Reactions  . Hydrocodone-Acetaminophen     REACTION: unspecified  . Sulfamethoxazole     REACTION: unspecified    Current Outpatient Prescriptions on File Prior to Visit  Medication Sig Dispense Refill  . aspirin EC 81 MG tablet Take 1 tablet (81 mg total) by mouth daily.      .  pregabalin (LYRICA) 75 MG capsule Take 1 capsule (75 mg total) by mouth 3 (three) times daily.        BP 150/90  Pulse 84  Temp 97.9 F (36.6 C) (Oral)  Resp 18  Wt 242 lb (109.77 kg)          Review of Systems  Musculoskeletal: Positive for back pain, joint swelling, arthralgias and gait problem.       Objective:   Physical Exam  Constitutional: He appears well-developed and well-nourished. No distress.       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  The patient was asked to consult with his lawyer and to ask specifically what further information in this office may provide  Follow up with PCP

## 2012-07-04 NOTE — Progress Notes (Signed)
Quick Note:  I left voice message with results. ______ 

## 2012-07-05 NOTE — Progress Notes (Signed)
Quick Note:  I left voice message with results. ______ 

## 2012-12-30 ENCOUNTER — Telehealth: Payer: Self-pay | Admitting: Internal Medicine

## 2012-12-30 NOTE — Telephone Encounter (Signed)
Appointment with dr Kirtland Bouchard on 5-29/wife aware

## 2012-12-30 NOTE — Telephone Encounter (Signed)
Patient Information:  Caller Name: Thedora Hinders  Phone: [phone]  Patient: [name] [name], [name] [name]  Gender: Male  DOB: 1955/09/09  Age: 57 Years  PCP: Darryll Capers (Adults only)  Office Follow Up:  Does the office need to follow up with this patient?: Yes  Instructions For The Office: Pt is needing an appt for depression; CAN is unable to schedule these types of appt; OFFICE PLEASE REVIEW AND CALL WIFE TO SCHEDULE APPT- wife states that she spoke to the office earlier today and they told her they couldn't get pt in until August,  RN Note:  Offered appt for today but wife is requesting appt for next week. Instructed that message sent to office to schedule appt and she will receive a call back from office shortly; iff sx worsen or change or any thoughts of hurtinghimself or others to call back or 911; will comply  Symptoms  Reason For Call & Symptoms: Wife is calling and states that husband has been having depression issues; sx started approx 3 months ago;  would like to get husband in to talk to MD; sx include "short fussed;"; not sleeping well; nightmares; thinks that his knee surgery 3 months ago brought this on;  wife "wonders" if he has thoughts of suicide but she is unsure; wife denies need for ED or any agency help; she is requesting office appt  Reviewed Health History In EMR: Yes  Reviewed Medications In EMR: Yes  Reviewed Allergies In EMR: Yes  Reviewed Surgeries / Procedures: Yes  Date of Onset of Symptoms: 10/02/2012  Guideline(s) Used:  Depression  Disposition Per Guideline:   See Within 3 Days in Office  Reason For Disposition Reached:   Patient wants to be seen  Advice Given:  N/A  Patient Will Follow Care Advice:  YES

## 2012-12-30 NOTE — Telephone Encounter (Signed)
Bonnye, please see note and advise re appt, as Dr Lovell Sheehan has nothing avail. Thanks.

## 2013-01-05 ENCOUNTER — Encounter: Payer: Self-pay | Admitting: Internal Medicine

## 2013-01-05 ENCOUNTER — Ambulatory Visit (INDEPENDENT_AMBULATORY_CARE_PROVIDER_SITE_OTHER): Payer: BC Managed Care – PPO | Admitting: Internal Medicine

## 2013-01-05 VITALS — BP 138/80 | HR 87 | Temp 97.9°F | Resp 20 | Wt 242.0 lb

## 2013-01-05 DIAGNOSIS — F329 Major depressive disorder, single episode, unspecified: Secondary | ICD-10-CM

## 2013-01-05 MED ORDER — BUPROPION HCL ER (XL) 150 MG PO TB24: 150.0000 mg | ORAL_TABLET | Freq: Every day | ORAL | Status: DC

## 2013-01-05 NOTE — Patient Instructions (Signed)
It is important that you exercise regularly, at least 20 minutes 3 to 4 times per week.  If you develop chest pain or shortness of breath seek  medical attention.  Call or return to clinic prn if these symptoms worsen or fail to improve as anticipated.  
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
## 2013-02-08 ENCOUNTER — Ambulatory Visit (INDEPENDENT_AMBULATORY_CARE_PROVIDER_SITE_OTHER): Payer: BC Managed Care – PPO | Admitting: Internal Medicine

## 2013-02-08 ENCOUNTER — Encounter: Payer: Self-pay | Admitting: Internal Medicine

## 2013-02-08 VITALS — BP 140/90 | HR 84 | Temp 97.9°F | Resp 20 | Wt 240.0 lb

## 2013-02-08 DIAGNOSIS — R7989 Other specified abnormal findings of blood chemistry: Secondary | ICD-10-CM

## 2013-02-08 DIAGNOSIS — M12579 Traumatic arthropathy, unspecified ankle and foot: Secondary | ICD-10-CM

## 2013-02-08 DIAGNOSIS — F329 Major depressive disorder, single episode, unspecified: Secondary | ICD-10-CM

## 2013-02-08 DIAGNOSIS — Z23 Encounter for immunization: Secondary | ICD-10-CM

## 2013-02-08 MED ORDER — BUPROPION HCL ER (XL) 300 MG PO TB24: 300.0000 mg | ORAL_TABLET | Freq: Every day | ORAL | Status: DC

## 2013-02-08 MED ORDER — ALLOPURINOL 300 MG PO TABS: 300.0000 mg | ORAL_TABLET | Freq: Every day | ORAL | Status: DC

## 2013-02-08 MED ORDER — TRAMADOL HCL 50 MG PO TABS: 50.0000 mg | ORAL_TABLET | Freq: Three times a day (TID) | ORAL | Status: DC | PRN

## 2013-02-08 NOTE — Patient Instructions (Signed)
Return in 3 months for follow-up  

## 2013-02-08 NOTE — Progress Notes (Signed)
Subjective:    Patient ID: [name] [name], male    DOB: October 11, 1955, 57 y.o.   MRN: [id number]  HPI   57 year old patient who is seen today for followup of depression. He was placed on Wellbutrin and has had a very nice clinical response. His anxiety has also much improved His chief complaint today is pain involving primarily left ankle and right knee. He does have a history of gout and hyperuricemia. He is on allopurinol 200 mg daily. He also has a history chronic arthritis involving the right ankle.  Past Medical History  Diagnosis Date  . Hx of gout   . Hx of sinusitis   . Inflammation of foot due to trauma     History   Social History  . Marital Status: Married    Spouse Name: N/A    Number of Children: N/A  . Years of Education: N/A   Occupational History  . Not on file.   Social History Main Topics  . Smoking status: Former Games developer  . Smokeless tobacco: Never Used  . Alcohol Use: 0.5 oz/week    1 drink(s) per week  . Drug Use: No  . Sexually Active: Yes   Other Topics Concern  . Not on file   Social History Narrative  . No narrative on file    Past Surgical History  Procedure Laterality Date  . Hernia repair    . Foot surgery  1992  . Neck surgery  2005  . Back surgery  2005  . Knee arthroscopy  2003  . Fusion c6-7      dr Fabiola Backer  . Spine surgery    . Lumber fusion      dr cabel    No family history on file.  Allergies  Allergen Reactions  . Hydrocodone-Acetaminophen     REACTION: unspecified  . Sulfamethoxazole     REACTION: unspecified    Current Outpatient Prescriptions on File Prior to Visit  Medication Sig Dispense Refill  . aspirin EC 81 MG tablet Take 1 tablet (81 mg total) by mouth daily.      Marland Kitchen buPROPion (WELLBUTRIN XL) 150 MG 24 hr tablet Take 1 tablet (150 mg total) by mouth daily.  180 tablet  1  . meloxicam (MOBIC) 15 MG tablet Take 15 mg by mouth daily.       No current facility-administered medications on file prior to visit.     BP 140/90  Pulse 84  Temp(Src) 97.9 F (36.6 C) (Oral)  Resp 20  Wt 240 lb (108.863 kg)  BMI 34.44 kg/m2  SpO2 98%       Review of Systems  Constitutional: Negative for fever, chills, appetite change and fatigue.  HENT: Negative for hearing loss, ear pain, congestion, sore throat, trouble swallowing, neck stiffness, dental problem, voice change and tinnitus.   Eyes: Negative for pain, discharge and visual disturbance.  Respiratory: Negative for cough, chest tightness, wheezing and stridor.   Cardiovascular: Negative for chest pain, palpitations and leg swelling.  Gastrointestinal: Negative for nausea, vomiting, abdominal pain, diarrhea, constipation, blood in stool and abdominal distention.  Genitourinary: Negative for urgency, hematuria, flank pain, discharge, difficulty urinating and genital sores.  Musculoskeletal: Positive for arthralgias and gait problem. Negative for myalgias, back pain and joint swelling.  Skin: Negative for rash.  Neurological: Negative for dizziness, syncope, speech difficulty, weakness, numbness and headaches.  Hematological: Negative for adenopathy. Does not bruise/bleed easily.  Psychiatric/Behavioral: Negative for behavioral problems and dysphoric mood. The patient is not nervous/anxious.  Objective:   Physical Exam  Constitutional: He appears well-developed and well-nourished. No distress.  Psychiatric: He has a normal mood and affect. His behavior is normal. Judgment and thought content normal.          Assessment & Plan:   Depression. Nice clinical response. We'll continue Wellbutrin 300 mg daily Arthralgias. The patient is not taking meloxicam a regular basis this will be taken as needed. Was also given a prescription for tramadol to take when necessary   Followup 3 months

## 2013-02-09 ENCOUNTER — Ambulatory Visit: Payer: BC Managed Care – PPO | Admitting: Internal Medicine

## 2013-03-20 ENCOUNTER — Telehealth: Payer: Self-pay | Admitting: Internal Medicine

## 2013-03-20 ENCOUNTER — Ambulatory Visit (INDEPENDENT_AMBULATORY_CARE_PROVIDER_SITE_OTHER): Payer: BC Managed Care – PPO | Admitting: Family Medicine

## 2013-03-20 VITALS — BP 120/84 | Temp 98.4°F | Wt 238.0 lb

## 2013-03-20 DIAGNOSIS — S39012A Strain of muscle, fascia and tendon of lower back, initial encounter: Secondary | ICD-10-CM

## 2013-03-20 DIAGNOSIS — IMO0002 Reserved for concepts with insufficient information to code with codable children: Secondary | ICD-10-CM

## 2013-03-20 DIAGNOSIS — K824 Cholesterolosis of gallbladder: Secondary | ICD-10-CM

## 2013-03-20 NOTE — Telephone Encounter (Signed)
Okay but would prefer that patient be seen by another provider

## 2013-03-20 NOTE — Telephone Encounter (Signed)
Patient wished to transfer from Dr Lovell Sheehan to Dr. Kirtland Bouchard due to lack of availability. Please advise approved/not approved. Thank you.

## 2013-03-20 NOTE — Progress Notes (Signed)
Chief Complaint  Patient presents with  . Back Pain    HPI:  [name] [name] is a 57 yo M patient of Dr. Amador Cunas with PMH sig for depression here for acute visit for back pain: -has chronic back issues -has had some R sided pain in side last few weeks after a bunch of gardening work - feel like pulled muscle  -denies: fevers, hematuria, LUTS, numbness, weakness, bowel or bladder issues  GB polyps: -on [place] and told needed to follow up in 3-6 months to repeat it -no abd pain or nausea, vomiting or bowel issues ROS: See pertinent positives and negatives per HPI.  Past Medical History  Diagnosis Date  . Hx of gout   . Hx of sinusitis   . Inflammation of foot due to trauma     No family history on file.  History   Social History  . Marital Status: Married    Spouse Name: N/A    Number of Children: N/A  . Years of Education: N/A   Social History Main Topics  . Smoking status: Former Games developer  . Smokeless tobacco: Never Used  . Alcohol Use: 0.5 oz/week    1 drink(s) per week  . Drug Use: No  . Sexually Active: Yes   Other Topics Concern  . Not on file   Social History Narrative  . No narrative on file    Current outpatient prescriptions:allopurinol (ZYLOPRIM) 300 MG tablet, Take 1 tablet (300 mg total) by mouth daily., Disp: 90 tablet, Rfl: 4;  buPROPion (WELLBUTRIN XL) 300 MG 24 hr tablet, Take 1 tablet (300 mg total) by mouth daily., Disp: 90 tablet, Rfl: 3;  meloxicam (MOBIC) 15 MG tablet, Take 15 mg by mouth daily., Disp: , Rfl:  traMADol (ULTRAM) 50 MG tablet, Take 1 tablet (50 mg total) by mouth every 8 (eight) hours as needed for pain., Disp: 30 tablet, Rfl: 0  EXAM:  Filed Vitals:   03/20/13 1555  BP: 120/84  Temp: 98.4 F (36.9 C)    Body mass index is 34.15 kg/(m^2).  GENERAL: vitals reviewed and listed above, alert, oriented, appears well hydrated and in no acute distress  HEENT: atraumatic, conjunttiva clear, no obvious abnormalities on inspection  of external nose and ears  NECK: no obvious masses on inspection  LUNGS: clear to auscultation bilaterally, no wheezes, rales or rhonchi, good air movement  CV: HRRR, no peripheral edema  MS: moves all extremities without noticeable abnormality Normal Gait Normal inspection of back, no obvious scoliosis or leg length descrepancy No bony TTP Soft tissue TTP at: R QL muscle, reproduces pain -/+ tests: neg trendelenburg,-facet loading, -SLRT, -CLRT, -FABER, -FADIR Normal muscle strength, sensation to light touch and DTRs in LEs bilaterally  PSYCH: pleasant and cooperative, no obvious depression or anxiety  ASSESSMENT AND PLAN:  Discussed the following assessment and plan:  Back strain, initial encounter  Gallbladder polyp - Plan: US Abdomen Limited RUQ  -supportive care and return precautions for muscle strain -[place] from 06/2012 recs repeat US to assess stability of GB polyps - pt reports he just had not followed up on this but wants to get [place] now, no symptoms - [place] ordered -Patient advised to return or notify a doctor immediately if symptoms worsen or persist or new concerns arise.  Patient Instructions  -We placed a referral for you as discussed for the ultrasound. It usually takes about 1-2 weeks to process and schedule this referral. If you have not heard from [place] regarding this appointment  in 2 weeks please contact our office.  -heat for 15 minutes twice daily  -ibuprofen or tylenol as instructed if needed  -follow up with your doctor in 3-4 weeks if not better      [name] R.

## 2013-03-20 NOTE — Telephone Encounter (Signed)
Okay but would prefer patient be seen by another provider

## 2013-03-20 NOTE — Telephone Encounter (Signed)
May change

## 2013-03-20 NOTE — Patient Instructions (Addendum)
-  We placed a referral for you as discussed for the ultrasound. It usually takes about 1-2 weeks to process and schedule this referral. If you have not heard from [place] regarding this appointment in 2 weeks please contact our office.  -heat for 15 minutes twice daily  -ibuprofen or tylenol as instructed if needed  -follow up with your doctor in 3-4 weeks if not better

## 2013-03-28 ENCOUNTER — Ambulatory Visit
Admission: RE | Admit: 2013-03-28 | Discharge: 2013-03-28 | Disposition: A | Discharge status: Home / Self Care | Payer: BC Managed Care – PPO | Source: Ambulatory Visit | Attending: Family Medicine | Admitting: Family Medicine

## 2013-03-28 DIAGNOSIS — K824 Cholesterolosis of gallbladder: Secondary | ICD-10-CM

## 2013-03-28 NOTE — Progress Notes (Signed)
Quick Note:  Called and spoke with pt and pt is aware. ______ 

## 2013-04-25 ENCOUNTER — Ambulatory Visit (INDEPENDENT_AMBULATORY_CARE_PROVIDER_SITE_OTHER): Payer: BC Managed Care – PPO | Admitting: Internal Medicine

## 2013-04-25 ENCOUNTER — Encounter: Payer: Self-pay | Admitting: Internal Medicine

## 2013-04-25 VITALS — BP 150/90 | HR 77 | Temp 98.2°F | Resp 20 | Wt 239.0 lb

## 2013-04-25 DIAGNOSIS — M542 Cervicalgia: Secondary | ICD-10-CM

## 2013-04-25 MED ORDER — METHYLPREDNISOLONE ACETATE 80 MG/ML IJ SUSP
80.0000 mg | Freq: Once | INTRAMUSCULAR | Status: AC
  Administered 2013-04-25: 80 mg via INTRAMUSCULAR

## 2013-04-25 NOTE — Progress Notes (Signed)
Subjective:    Patient ID: [name] [name], male    DOB: 06-11-1956, 57 y.o.   MRN: [id number]  HPI  57 year old patient who presents with a one-month history of posterior neck pain. He has a history of cervical and lumbar disc disease and has had the a cervical fusion in the past. He describes some paresthesias involving his right upper arm from the shoulder to the elbow. Denies any motor weakness. He describes decreased range of motion of the neck.  Past Medical History  Diagnosis Date  . Hx of gout   . Hx of sinusitis   . Inflammation of foot due to trauma     History   Social History  . Marital Status: Married    Spouse Name: N/A    Number of Children: N/A  . Years of Education: N/A   Occupational History  . Not on file.   Social History Main Topics  . Smoking status: Former Games developer  . Smokeless tobacco: Never Used  . Alcohol Use: 0.5 oz/week    1 drink(s) per week  . Drug Use: No  . Sexual Activity: Yes   Other Topics Concern  . Not on file   Social History Narrative  . No narrative on file    Past Surgical History  Procedure Laterality Date  . Hernia repair    . Foot surgery  1992  . Neck surgery  2005  . Back surgery  2005  . Knee arthroscopy  2003  . Fusion c6-7      dr Fabiola Backer  . Spine surgery    . Lumber fusion      dr cabel    No family history on file.  Allergies  Allergen Reactions  . Hydrocodone-Acetaminophen     REACTION: unspecified  . Sulfamethoxazole     REACTION: unspecified    Current Outpatient Prescriptions on File Prior to Visit  Medication Sig Dispense Refill  . allopurinol (ZYLOPRIM) 300 MG tablet Take 1 tablet (300 mg total) by mouth daily.  90 tablet  4  . buPROPion (WELLBUTRIN XL) 300 MG 24 hr tablet Take 1 tablet (300 mg total) by mouth daily.  90 tablet  3  . meloxicam (MOBIC) 15 MG tablet Take 15 mg by mouth daily.      . traMADol (ULTRAM) 50 MG tablet Take 1 tablet (50 mg total) by mouth every 8 (eight) hours as needed  for pain.  30 tablet  0   No current facility-administered medications on file prior to visit.    BP 150/90  Pulse 77  Temp(Src) 98.2 F (36.8 C) (Oral)  Resp 20  Wt 239 lb (108.41 kg)  BMI 34.29 kg/m2  SpO2 98%       Review of Systems  Constitutional: Negative for fever, chills, appetite change and fatigue.  HENT: Negative for hearing loss, ear pain, congestion, sore throat, trouble swallowing, neck stiffness, dental problem, voice change and tinnitus.   Eyes: Negative for pain, discharge and visual disturbance.  Respiratory: Negative for cough, chest tightness, wheezing and stridor.   Cardiovascular: Negative for chest pain, palpitations and leg swelling.  Gastrointestinal: Negative for nausea, vomiting, abdominal pain, diarrhea, constipation, blood in stool and abdominal distention.  Genitourinary: Negative for urgency, hematuria, flank pain, discharge, difficulty urinating and genital sores.  Musculoskeletal: Positive for back pain and arthralgias. Negative for myalgias, joint swelling and gait problem.       Posterior neck pain  Skin: Negative for rash.  Neurological: Negative for dizziness, syncope, speech  difficulty, weakness, numbness and headaches.  Hematological: Negative for adenopathy. Does not bruise/bleed easily.  Psychiatric/Behavioral: Negative for behavioral problems and dysphoric mood. The patient is not nervous/anxious.        Objective:   Physical Exam  Constitutional: He appears well-developed and well-nourished. No distress.  Musculoskeletal:  Slight decreased range of motion of the neck especially extension   Neurological:  Suggestion of very mild diminished right grip strength Arm extension and flexion appear to be normal  The right biceps and triceps reflex diminished          Assessment & Plan:    posterior neck pain. Patient has a long history of cervical disc disease and is status post cervical fusion. For the past month he has had  increasing neck pain a sister with some discomfort in the right upper arm. His biceps and triceps reflex appears to be diminished but unclear whether this is chronic Will treat with Depo-Medrol and observe continue Mobic and analgesics

## 2013-04-25 NOTE — Patient Instructions (Addendum)
Call or return to clinic prn if these symptoms worsen or fail to improve as anticipated. Neurosurgical followup if no improvement

## 2013-05-11 ENCOUNTER — Ambulatory Visit: Payer: BC Managed Care – PPO | Admitting: Internal Medicine

## 2013-06-01 ENCOUNTER — Encounter: Payer: Self-pay | Admitting: Internal Medicine

## 2013-06-01 ENCOUNTER — Telehealth: Payer: Self-pay | Admitting: Internal Medicine

## 2013-06-01 ENCOUNTER — Other Ambulatory Visit: Payer: Self-pay | Admitting: Internal Medicine

## 2013-06-01 ENCOUNTER — Ambulatory Visit (INDEPENDENT_AMBULATORY_CARE_PROVIDER_SITE_OTHER)
Admission: RE | Admit: 2013-06-01 | Discharge: 2013-06-01 | Disposition: A | Discharge status: Home / Self Care | Payer: BC Managed Care – PPO | Source: Ambulatory Visit | Attending: Internal Medicine | Admitting: Internal Medicine

## 2013-06-01 ENCOUNTER — Ambulatory Visit (INDEPENDENT_AMBULATORY_CARE_PROVIDER_SITE_OTHER): Payer: BC Managed Care – PPO | Admitting: Internal Medicine

## 2013-06-01 VITALS — BP 130/80 | HR 85 | Temp 98.1°F | Ht 70.0 in | Wt 234.4 lb

## 2013-06-01 DIAGNOSIS — F3289 Other specified depressive episodes: Secondary | ICD-10-CM

## 2013-06-01 DIAGNOSIS — F329 Major depressive disorder, single episode, unspecified: Secondary | ICD-10-CM

## 2013-06-01 DIAGNOSIS — M546 Pain in thoracic spine: Secondary | ICD-10-CM

## 2013-06-01 MED ORDER — OXYCODONE HCL 5 MG PO TABS: ORAL_TABLET | ORAL | Status: DC

## 2013-06-01 MED ORDER — CYCLOBENZAPRINE HCL 5 MG PO TABS: 5.0000 mg | ORAL_TABLET | Freq: Three times a day (TID) | ORAL | Status: DC | PRN

## 2013-06-01 MED ORDER — PREDNISONE 10 MG PO TABS: ORAL_TABLET | ORAL | Status: DC

## 2013-06-01 NOTE — Telephone Encounter (Signed)
Patient Information:  Caller Name: Fuad  Phone: [phone]  Patient: [name] [name], [name] [name]  Gender: Male  DOB: May 21, 1956  Age: 57 Years  PCP: Eleonore Chiquito (Family Practice > 22yrs old)  Office Follow Up:  Does the office need to follow up with this patient?: No  Instructions For The Office: N/A   Symptoms  Reason For Call & Symptoms: c/o neck and mid back pain.  Pt reports Dr Kirtland Bouchard gave him an injection at last appt which helped.  05/31/13 pt was holding a saw up and looking under it and fell down due to sudden severe back pain in center of back, took breath away, states he feels like he has been kicked in his spine.  Pt was able to walk into house from garage, used tramadol and heating pad.  Spine cracking/popping with each movement.  06/01/13 burning and cramping in middle of back, radiates into buttocks/legs.  Rates pain 6-7/10.  Reviewed Health History In EMR: Yes  Reviewed Medications In EMR: Yes  Reviewed Allergies In EMR: Yes  Reviewed Surgeries / Procedures: Yes  Date of Onset of Symptoms: 05/31/2013  Treatments Tried: tramadol, heating pad, muscle relaxer.  Treatments Tried Worked: No  Guideline(s) Used:  Back Pain  Disposition Per Guideline:   See Today in Office  Reason For Disposition Reached:   Pain radiates into the thigh or further down the leg, and in both legs  Advice Given:  Cold or Heat:  Heat Pack: If pain lasts over 2 days, apply heat to the sore area. Use a heat pack, heating pad, or warm wet washcloth. Do this for 10 minutes, then as needed. For widespread stiffness, take a hot bath or hot shower instead. Move the sore area under the warm water.  Sleep:  Sleep on your side with a pillow between your knees. If you sleep on your back, put a pillow under your knees.  Avoid sleeping on your stomach.  Your mattress should be firm. Avoid waterbeds.  Activity  Keep doing your day-to-day activities if it is not too painful. Staying active is better than  resting.  Avoid anything that makes your pain worse. Avoid heavy lifting, twisting, and too much exercise until your back heals.  You do not need to stay in bed.  Call Back If:  You become worse.  Patient Will Follow Care Advice:  YES  Appt: Dr Jonny Ruiz at [place] location, 11:15am 06/01/13 (No appt's available at [facility] location)

## 2013-06-01 NOTE — Progress Notes (Signed)
Subjective:    Patient ID: [name] [name], male    DOB: 26-Jul-1956, 57 y.o.   MRN: [id number]  HPI  Here with back pain acute worsening mod to severe, mid/upper thoracic midline and radiating to bilat periscapular areas, but position and shoulder/neck movement not leaving better or worse;  Has complicated hx of lumbar dz s/p laminectomy and fusion.  Did have recent cortisone for neck pain that helped, but still with marked arm fatigue and pain with working with hands up at chest level.  Some frustration over ongoing pain, and with worsening now states several times I need to "get with my orthopedic to finally get to bottom of this."  Pt denies bowel or bladder change, fever, wt loss,  worsening LE pain/numbness/weakness, gait change or falls.  Pt denies chest pain, increased sob or doe, wheezing, orthopnea, PND, increased LE swelling, palpitations, dizziness or syncope.  Denies worsening depressive symptoms, suicidal ideation, or panic Past Medical History  Diagnosis Date  . Hx of gout   . Hx of sinusitis   . Inflammation of foot due to trauma    Past Surgical History  Procedure Laterality Date  . Hernia repair    . Foot surgery  1992  . Neck surgery  2005  . Back surgery  2005  . Knee arthroscopy  2003  . Fusion c6-7      dr Fabiola Backer  . Spine surgery    . Lumber fusion      dr cabel    reports that he has quit smoking. He has never used smokeless tobacco. He reports that he drinks about 0.5 ounces of alcohol per week. He reports that he does not use illicit drugs. family history is not on file. Allergies  Allergen Reactions  . Hydrocodone-Acetaminophen     REACTION: unspecified  . Sulfamethoxazole     REACTION: unspecified   Current Outpatient Prescriptions on File Prior to Visit  Medication Sig Dispense Refill  . allopurinol (ZYLOPRIM) 300 MG tablet Take 1 tablet (300 mg total) by mouth daily.  90 tablet  4  . buPROPion (WELLBUTRIN XL) 300 MG 24 hr tablet Take 1 tablet (300 mg  total) by mouth daily.  90 tablet  3  . meloxicam (MOBIC) 15 MG tablet Take 15 mg by mouth daily.      . traMADol (ULTRAM) 50 MG tablet Take 1 tablet (50 mg total) by mouth every 8 (eight) hours as needed for pain.  30 tablet  0   No current facility-administered medications on file prior to visit.   Review of Systems  Constitutional: Negative for unexpected weight change, or unusual diaphoresis  HENT: Negative for tinnitus.   Eyes: Negative for photophobia and visual disturbance.  Respiratory: Negative for choking and stridor.   Gastrointestinal: Negative for vomiting and blood in stool.  Genitourinary: Negative for hematuria and decreased urine volume.  Musculoskeletal: Negative for acute joint swelling Skin: Negative for color change and wound.  Neurological: Negative for tremors and numbness other than noted  Psychiatric/Behavioral: Negative for decreased concentration or  hyperactivity.       Objective:   Physical Exam BP 130/80  Pulse 85  Temp(Src) 98.1 F (36.7 C) (Oral)  Ht 5\' 10"  (1.778 m)  Wt 234 lb 6 oz (106.312 kg)  BMI 33.63 kg/m2  SpO2 97% VS noted,  Constitutional: Pt appears well-developed and well-nourished. Lavella Lemons HENT: Head: NCAT.  Right Ear: External ear normal.  Left Ear: External ear normal.  Eyes: Conjunctivae and EOM are normal.  Pupils are equal, round, and reactive to light.  Neck: Normal range of motion. Neck supple.  Cardiovascular: Normal rate and regular rhythm.   Pulmonary/Chest: Effort normal and breath sounds normal.  Abd:  Soft, NT, non-distended, + BS Spine: nontender thoracic in midline, has bilat paravertebral tender with prob musc spasm on left, no red/swelling/rash Neurological: Pt is alert. Not confused , motor 5/5, gait intact Skin: Skin is warm. No erythema.  Psychiatric: Pt behavior is normal. Thought content normal. mild dysphoric    Assessment & Plan:

## 2013-06-01 NOTE — Telephone Encounter (Signed)
Noted  

## 2013-06-01 NOTE — Patient Instructions (Signed)
Please take all new medication as prescribed - the pain medication (in hardcopy) and muscle relaxer (sent to the pharmacy) Please continue all other medications as before Please have the pharmacy call with any other refills you may need.  Please go to the XRAY Department in the Basement (go straight as you get off the elevator) for the x-ray testing  You will be contacted by phone if any changes need to be made immediately.  Otherwise, you will receive a letter about your results with an explanation, but please check with MyChart first.  Please call if you change your mind about the referral back to Dr Franky Macho

## 2013-06-02 ENCOUNTER — Telehealth: Payer: Self-pay | Admitting: *Deleted

## 2013-06-02 NOTE — Telephone Encounter (Signed)
Called the patient left detailed message of MD instructions of results.

## 2013-06-02 NOTE — Telephone Encounter (Signed)
Pt is requesting xray results.

## 2013-06-02 NOTE — Telephone Encounter (Signed)
A letter has been sent; there is no acute problem such as fracture.  Pt should f/u with his orthopedic as well

## 2013-06-04 NOTE — Assessment & Plan Note (Signed)
I suspect muscular strain but will need t-spine film, pain control, f/u ortho if not improved

## 2013-06-04 NOTE — Assessment & Plan Note (Addendum)
stable overall by history and exam, and pt to continue medical treatment as before,  to f/u any worsening symptoms or concerns, declines need for tx or referral at this time

## 2014-03-02 ENCOUNTER — Other Ambulatory Visit: Payer: Self-pay | Admitting: Internal Medicine

## 2014-06-23 ENCOUNTER — Other Ambulatory Visit: Payer: Self-pay | Admitting: Internal Medicine

## 2014-08-02 ENCOUNTER — Telehealth: Payer: Self-pay | Admitting: Internal Medicine

## 2014-08-02 DIAGNOSIS — Z0000 Encounter for general adult medical examination without abnormal findings: Secondary | ICD-10-CM

## 2014-08-02 MED ORDER — ALLOPURINOL 300 MG PO TABS: 300.0000 mg | ORAL_TABLET | Freq: Every day | ORAL | Status: DC

## 2014-08-02 NOTE — Telephone Encounter (Signed)
Pt aware rx sent to pharm, conditional upon appt. Pt's cpe has been sch Friday, 2/19 @ 2:45 pm.  However , due to his work schedule, pt would like to go to [facility] for his labs. Can you put the orders in?

## 2014-08-02 NOTE — Telephone Encounter (Signed)
Please call pt and schedule physical, Rx sent to pharmacy 3 month supply.

## 2014-08-02 NOTE — Telephone Encounter (Signed)
Pt request refill allopurinol (ZYLOPRIM) 300 MG tablet . Pt states he is having a flair up of gout in his hands and feet and it is painful Pt not seen in over a year. pls advise if can refill and does pt need cpe? [facility]

## 2014-08-06 NOTE — Telephone Encounter (Signed)
[name] [name], I put lab orders in for pt to have Physical labs done at [facility]. Please call pt and let him know and remind him to fast for labs.

## 2014-08-07 ENCOUNTER — Encounter: Payer: Self-pay | Admitting: Family Medicine

## 2014-08-07 ENCOUNTER — Ambulatory Visit (INDEPENDENT_AMBULATORY_CARE_PROVIDER_SITE_OTHER): Payer: BC Managed Care – PPO | Admitting: Family Medicine

## 2014-08-07 VITALS — BP 128/90 | HR 85 | Temp 98.0°F | Ht 70.0 in | Wt 237.1 lb

## 2014-08-07 DIAGNOSIS — M109 Gout, unspecified: Secondary | ICD-10-CM

## 2014-08-07 NOTE — Progress Notes (Signed)
HPI:  Acute visit for:  "gout" -started 5 days ago -hx of gout dx in the past with uric acid levels -he was out of his allopurinol for some time ( 1year)and this was refilled a few days ago -has indomethacin he takes for flares - was expired from several years ago but this did not help, ibuprofen has helped some and symptoms are improving now -L 1st MCP is worse location, some pain in hands as well -hx traumatic arthropathy R ankle -denies: fevers, malaise, chills -reports has taken prednisone in the past for knee pain and tolerated this well             ROS: See pertinent positives and negatives per HPI.  Past Medical History  Diagnosis Date  . Hx of gout   . Hx of sinusitis   . Inflammation of foot due to trauma     Past Surgical History  Procedure Laterality Date  . Hernia repair    . Foot surgery  1992  . Neck surgery  2005  . Back surgery  2005  . Knee arthroscopy  2003  . Fusion c6-7      dr Larose Hires  . Spine surgery    . Lumber fusion      dr cabel    No family history on file.  History   Social History  . Marital Status: Married    Spouse Name: N/A    Number of Children: N/A  . Years of Education: N/A   Social History Main Topics  . Smoking status: Former Research scientist (life sciences)  . Smokeless tobacco: Never Used  . Alcohol Use: 0.5 oz/week    1 drink(s) per week  . Drug Use: No  . Sexual Activity: Yes   Other Topics Concern  . None   Social History Narrative    Current outpatient prescriptions: allopurinol (ZYLOPRIM) 300 MG tablet, Take 1 tablet (300 mg total) by mouth daily., Disp: 90 tablet, Rfl: 0;  buPROPion (WELLBUTRIN XL) 300 MG 24 hr tablet, TAKE 1 TABLET (300 MG TOTAL) BY MOUTH DAILY., Disp: 90 tablet, Rfl: 0;  meloxicam (MOBIC) 15 MG tablet, Take 15 mg by mouth daily., Disp: , Rfl:  traMADol (ULTRAM) 50 MG tablet, Take 1 tablet (50 mg total) by mouth every 8 (eight) hours as needed for pain., Disp: 30 tablet, Rfl: 0  EXAM:  Filed Vitals:   08/07/14 1558   BP: 128/90  Pulse: 85  Temp: 98 F (36.7 C)    Body mass index is 34.02 kg/(m^2).  GENERAL: vitals reviewed and listed above, alert, oriented, appears well hydrated and in no acute distress  HEENT: atraumatic, conjunttiva clear, no obvious abnormalities on inspection of external nose and ears  NECK: no obvious masses on inspection  LUNGS: clear to auscultation bilaterally, no wheezes, rales or rhonchi, good air movement  CV: HRRR, no peripheral edema  MS: moves all extremities without noticeable abnormality - mild erythema and swelling of L 1st MCP jt with TTP, mild TTP L wrist  PSYCH: pleasant and cooperative, no obvious depression or anxiety  ASSESSMENT AND PLAN:  Discussed the following assessment and plan:  Gout of multiple sites, unspecified cause, unspecified chronicity  -seems to be improving on NSAID (proper dosing discussed)- cont this for now, hold on re-initiation of allopurinol for 2 weeks -follow up as needed and as scheduled with PCP -Patient advised to return or notify a doctor immediately if symptoms worsen or persist or new concerns arise.  Patient Instructions  Gout Gout is  an inflammatory arthritis caused by a buildup of uric acid crystals in the joints. Uric acid is a chemical that is normally present in the blood. When the level of uric acid in the blood is too high it can form crystals that deposit in your joints and tissues. This causes joint redness, soreness, and swelling (inflammation). Repeat attacks are common. Over time, uric acid crystals can form into masses (tophi) near a joint, destroying bone and causing disfigurement. Gout is treatable and often preventable. CAUSES  The disease begins with elevated levels of uric acid in the blood. Uric acid is produced by your body when it breaks down a naturally found substance called purines. Certain foods you eat, such as meats and fish, contain high amounts of purines. Causes of an elevated uric acid level  include:  Being passed down from parent to child (heredity).  Diseases that cause increased uric acid production (such as obesity, psoriasis, and certain cancers).  Excessive alcohol use.  Diet, especially diets rich in meat and seafood.  Medicines, including certain cancer-fighting medicines (chemotherapy), water pills (diuretics), and aspirin.  Chronic kidney disease. The kidneys are no longer able to remove uric acid well.  Problems with metabolism. Conditions strongly associated with gout include:  Obesity.  High blood pressure.  High cholesterol.  Diabetes. Not everyone with elevated uric acid levels gets gout. It is not understood why some people get gout and others do not. Surgery, joint injury, and eating too much of certain foods are some of the factors that can lead to gout attacks. SYMPTOMS   An attack of gout comes on quickly. It causes intense pain with redness, swelling, and warmth in a joint.  Fever can occur.  Often, only one joint is involved. Certain joints are more commonly involved:  Base of the big toe.  Knee.  Ankle.  Wrist.  Finger. Without treatment, an attack usually goes away in a few days to weeks. Between attacks, you usually will not have symptoms, which is different from many other forms of arthritis. DIAGNOSIS  Your caregiver will suspect gout based on your symptoms and exam. In some cases, tests may be recommended. The tests may include:  Blood tests.  Urine tests.  X-rays.  Joint fluid exam. This exam requires a needle to remove fluid from the joint (arthrocentesis). Using a microscope, gout is confirmed when uric acid crystals are seen in the joint fluid. TREATMENT  There are two phases to gout treatment: treating the sudden onset (acute) attack and preventing attacks (prophylaxis).  Treatment of an Acute Attack.  Medicines are used. These include anti-inflammatory medicines or steroid medicines.  An injection of steroid  medicine into the affected joint is sometimes necessary.  The painful joint is rested. Movement can worsen the arthritis.  You may use warm or cold treatments on painful joints, depending which works best for you.  Treatment to Prevent Attacks.  If you suffer from frequent gout attacks, your caregiver may advise preventive medicine. These medicines are started after the acute attack subsides. These medicines either help your kidneys eliminate uric acid from your body or decrease your uric acid production. You may need to stay on these medicines for a very long time.  The early phase of treatment with preventive medicine can be associated with an increase in acute gout attacks. For this reason, during the first few months of treatment, your caregiver may also advise you to take medicines usually used for acute gout treatment. Be sure you understand your  caregiver's directions. Your caregiver may make several adjustments to your medicine dose before these medicines are effective.  Discuss dietary treatment with your caregiver or dietitian. Alcohol and drinks high in sugar and fructose and foods such as meat, poultry, and seafood can increase uric acid levels. Your caregiver or dietitian can advise you on drinks and foods that should be limited. HOME CARE INSTRUCTIONS   Do not take aspirin to relieve pain. This raises uric acid levels.  Only take over-the-counter or prescription medicines for pain, discomfort, or fever as directed by your caregiver.  Rest the joint as much as possible. When in bed, keep sheets and blankets off painful areas.  Keep the affected joint raised (elevated).  Apply warm or cold treatments to painful joints. Use of warm or cold treatments depends on which works best for you.  Use crutches if the painful joint is in your leg.  Drink enough fluids to keep your urine clear or pale yellow. This helps your body get rid of uric acid. Limit alcohol, sugary drinks, and  fructose drinks.  Follow your dietary instructions. Pay careful attention to the amount of protein you eat. Your daily diet should emphasize fruits, vegetables, whole grains, and fat-free or low-fat milk products. Discuss the use of coffee, vitamin C, and cherries with your caregiver or dietitian. These may be helpful in lowering uric acid levels.  Maintain a healthy body weight. SEEK MEDICAL CARE IF:   You develop diarrhea, vomiting, or any side effects from medicines.  You do not feel better in 24 hours, or you are getting worse. SEEK IMMEDIATE MEDICAL CARE IF:   Your joint becomes suddenly more tender, and you have chills or a fever. MAKE SURE YOU:   Understand these instructions.  Will watch your condition.  Will get help right away if you are not doing well or get worse. Document Released: 07/24/2000 Document Revised: 12/11/2013 Document Reviewed: 03/09/2012 [employer] Patient Information 2015 [place], [place]. This information is not intended to replace advice given to you by your health care provider. Make sure you discuss any questions you have with your health care provider.      Colin Benton R.

## 2014-08-07 NOTE — Progress Notes (Signed)
Pre visit review using our clinic review tool, if applicable. No additional management support is needed unless otherwise documented below in the visit note. 

## 2014-08-07 NOTE — Patient Instructions (Signed)

## 2014-08-09 ENCOUNTER — Telehealth: Payer: Self-pay | Admitting: Internal Medicine

## 2014-08-09 MED ORDER — PREDNISONE 10 MG PO TABS: ORAL_TABLET | ORAL | Status: DC

## 2014-08-09 NOTE — Telephone Encounter (Signed)
Pt was seen  On 08/07/14. Dx with gout. Pt would like to try course of prednisone . [facility],[place]

## 2014-08-09 NOTE — Telephone Encounter (Signed)
Patient informed. 

## 2014-08-09 NOTE — Telephone Encounter (Signed)
Ok sent - risks discussed at visit. If persistent or worsening needs to follow up with PCP.

## 2014-08-27 ENCOUNTER — Other Ambulatory Visit (INDEPENDENT_AMBULATORY_CARE_PROVIDER_SITE_OTHER): Payer: 59

## 2014-08-27 DIAGNOSIS — Z0000 Encounter for general adult medical examination without abnormal findings: Secondary | ICD-10-CM | POA: Diagnosis not present

## 2014-08-27 LAB — URINALYSIS
Bilirubin Urine: NEGATIVE
Hgb urine dipstick: NEGATIVE
KETONES UR: NEGATIVE
Leukocytes, UA: NEGATIVE
Nitrite: NEGATIVE
TOTAL PROTEIN, URINE-UPE24: NEGATIVE
UROBILINOGEN UA: 0.2 (ref 0.0–1.0)
Urine Glucose: NEGATIVE
pH: 6 (ref 5.0–8.0)

## 2014-08-27 LAB — CBC WITH DIFFERENTIAL/PLATELET
Basophils Absolute: 0 10*3/uL (ref 0.0–0.1)
Basophils Relative: 0.3 % (ref 0.0–3.0)
EOS ABS: 0.1 10*3/uL (ref 0.0–0.7)
EOS PCT: 1.4 % (ref 0.0–5.0)
HCT: 42.4 % (ref 39.0–52.0)
Hemoglobin: 14.2 g/dL (ref 13.0–17.0)
LYMPHS ABS: 2.9 10*3/uL (ref 0.7–4.0)
LYMPHS PCT: 30.3 % (ref 12.0–46.0)
MCHC: 33.6 g/dL (ref 30.0–36.0)
MCV: 92.6 fl (ref 78.0–100.0)
MONO ABS: 0.8 10*3/uL (ref 0.1–1.0)
MONOS PCT: 8.9 % (ref 3.0–12.0)
NEUTROS ABS: 5.6 10*3/uL (ref 1.4–7.7)
NEUTROS PCT: 59.1 % (ref 43.0–77.0)
Platelets: 296 10*3/uL (ref 150.0–400.0)
RBC: 4.58 Mil/uL (ref 4.22–5.81)
RDW: 12.4 % (ref 11.5–15.5)
WBC: 9.4 10*3/uL (ref 4.0–10.5)

## 2014-08-27 LAB — LIPID PANEL
CHOL/HDL RATIO: 5
Cholesterol: 188 mg/dL (ref 0–200)
HDL: 38.6 mg/dL — ABNORMAL LOW (ref >39.00)
NONHDL: 149.4
Triglycerides: 232 mg/dL — ABNORMAL HIGH (ref 0.0–149.0)
VLDL: 46.4 mg/dL — ABNORMAL HIGH (ref 0.0–40.0)

## 2014-08-27 LAB — HEPATIC FUNCTION PANEL
ALBUMIN: 4.3 g/dL (ref 3.5–5.2)
ALT: 34 U/L (ref 0–53)
AST: 24 U/L (ref 0–37)
Alkaline Phosphatase: 74 U/L (ref 39–117)
Bilirubin, Direct: 0.1 mg/dL (ref 0.0–0.3)
Total Bilirubin: 0.8 mg/dL (ref 0.2–1.2)
Total Protein: 6.8 g/dL (ref 6.0–8.3)

## 2014-08-27 LAB — BASIC METABOLIC PANEL
BUN: 14 mg/dL (ref 6–23)
CHLORIDE: 105 meq/L (ref 96–112)
CO2: 27 meq/L (ref 19–32)
CREATININE: 0.87 mg/dL (ref 0.40–1.50)
Calcium: 9.8 mg/dL (ref 8.4–10.5)
GFR: 95.99 mL/min (ref >60.00)
Glucose, Bld: 99 mg/dL (ref 70–99)
Potassium: 4.4 mEq/L (ref 3.5–5.1)
SODIUM: 139 meq/L (ref 135–145)

## 2014-08-27 LAB — LDL CHOLESTEROL, DIRECT: Direct LDL: 114 mg/dL

## 2014-08-27 LAB — PSA: PSA: 1.12 ng/mL (ref 0.10–4.00)

## 2014-08-27 LAB — TSH: TSH: 1.56 u[IU]/mL (ref 0.35–4.50)

## 2014-08-30 ENCOUNTER — Telehealth: Payer: Self-pay | Admitting: Internal Medicine

## 2014-08-30 MED ORDER — PREDNISONE 20 MG PO TABS: 20.0000 mg | ORAL_TABLET | Freq: Two times a day (BID) | ORAL | Status: DC

## 2014-08-30 NOTE — Telephone Encounter (Signed)
Pt notified Rx for Prednisone sent to pharmacy.

## 2014-08-30 NOTE — Telephone Encounter (Signed)
Pt saw dr Maudie Mercury on 08/07/14. Pt would like another prednisone rx for gout in hand and wrist. [facility]

## 2014-08-30 NOTE — Telephone Encounter (Signed)
Prednisone 20 mg  #10 one BID

## 2014-08-30 NOTE — Telephone Encounter (Signed)
Please advise of okay to refill Prednisone.

## 2014-09-14 ENCOUNTER — Encounter: Payer: Self-pay | Admitting: Internal Medicine

## 2014-09-14 ENCOUNTER — Telehealth: Payer: Self-pay | Admitting: Internal Medicine

## 2014-09-14 ENCOUNTER — Ambulatory Visit (INDEPENDENT_AMBULATORY_CARE_PROVIDER_SITE_OTHER): Payer: 59 | Admitting: Internal Medicine

## 2014-09-14 VITALS — BP 122/80 | HR 69 | Temp 97.5°F | Resp 20 | Ht 70.0 in | Wt 233.0 lb

## 2014-09-14 DIAGNOSIS — M1A00X0 Idiopathic chronic gout, unspecified site, without tophus (tophi): Secondary | ICD-10-CM

## 2014-09-14 MED ORDER — PREDNISONE 20 MG PO TABS: 20.0000 mg | ORAL_TABLET | Freq: Two times a day (BID) | ORAL | Status: DC

## 2014-09-14 NOTE — Telephone Encounter (Signed)
Patient Name: [name] [name] DOB: 01-12-1956 Initial Comment Caller states he has an appt on 2/20. Gout symptoms are debilitating the use of left hand, constant pain and numbness. Nurse Assessment Nurse: Marcelline Deist, RN, Lynda Date/Time (Eastern Time): 09/14/2014 9:48:27 AM Confirm and document reason for call. If symptomatic, describe symptoms. ---Caller states he has an appt on 2/20. Gout symptoms are debilitating the use of left hand, constant pain and numbness. This seems more constant than his usual flare ups. Has been dealing with this since Dec. Is taking Indomethacin. They called in some Prednisone as well, has had 2 rounds. Has the patient traveled out of the country within the last 30 days? ---Not Applicable Does the patient require triage? ---Yes Related visit to physician within the last 2 weeks? ---No Does the PT have any chronic conditions? (i.e. diabetes, asthma, etc.) ---Yes List chronic conditions. ---gout, ? arthritis Guidelines Guideline Title Affirmed Question Affirmed Notes Hand and Wrist Pain Numbness (i.e., loss of sensation) in hand or fingers (Exception: just tingling; numbness present > 2 weeks) Final Disposition User See Physician within [place], Therapist, sports, [place]

## 2014-09-14 NOTE — Progress Notes (Signed)
   Subjective:    Patient ID: [name] [name], male    DOB: 13-Oct-1955, 59 y.o.   MRN: [id number]  HPI  59 year old patient who has a history of chronic recurrent gout.  He had an acute flare in December and now has had increasing pain and inflammation involving his left wrist.  This is steroid responsive.  He has not been compliant with his allopurinol.  He is scheduled for a physical later this month  Past Medical History  Diagnosis Date  . Hx of gout   . Hx of sinusitis   . Inflammation of foot due to trauma     History   Social History  . Marital Status: Married    Spouse Name: N/A    Number of Children: N/A  . Years of Education: N/A   Occupational History  . Not on file.   Social History Main Topics  . Smoking status: Former Research scientist (life sciences)  . Smokeless tobacco: Never Used  . Alcohol Use: 0.5 oz/week    1 drink(s) per week  . Drug Use: No  . Sexual Activity: Yes   Other Topics Concern  . Not on file   Social History Narrative    Past Surgical History  Procedure Laterality Date  . Hernia repair    . Foot surgery  1992  . Neck surgery  2005  . Back surgery  2005  . Knee arthroscopy  2003  . Fusion c6-7      dr Larose Hires  . Spine surgery    . Lumber fusion      dr cabel    No family history on file.  Allergies  Allergen Reactions  . Hydrocodone-Acetaminophen     REACTION: unspecified  . Sulfamethoxazole     REACTION: unspecified    Current Outpatient Prescriptions on File Prior to Visit  Medication Sig Dispense Refill  . buPROPion (WELLBUTRIN XL) 300 MG 24 hr tablet TAKE 1 TABLET (300 MG TOTAL) BY MOUTH DAILY. 90 tablet 0  . meloxicam (MOBIC) 15 MG tablet Take 15 mg by mouth daily.    . traMADol (ULTRAM) 50 MG tablet Take 1 tablet (50 mg total) by mouth every 8 (eight) hours as needed for pain. 30 tablet 0  . allopurinol (ZYLOPRIM) 300 MG tablet Take 1 tablet (300 mg total) by mouth daily. (Patient not taking: Reported on 09/14/2014) 90 tablet 0   No current  facility-administered medications on file prior to visit.    BP 122/80 mmHg  Pulse 69  Temp(Src) 97.5 F (36.4 C) (Oral)  Resp 20  Ht 5\' 10"  (1.778 m)  Wt 233 lb (105.688 kg)  BMI 33.43 kg/m2  SpO2 98%     Review of Systems  Musculoskeletal: Positive for joint swelling and arthralgias.       Objective:   Physical Exam  Constitutional: He appears well-developed and well-nourished. No distress.  Musculoskeletal:  The left wrist is slightly warm to touch, painful and swollen          Assessment & Plan:   Acute gouty arthritis.  Patient was treated with prednisone wrist splint. Compliant with allopurinol discussed.  He will resume this 3-4 weeks after his acute attack resolves  CPX as scheduled

## 2014-09-14 NOTE — Progress Notes (Signed)
Pre visit review using our clinic review tool, if applicable. No additional management support is needed unless otherwise documented below in the visit note. 

## 2014-09-14 NOTE — Patient Instructions (Signed)
Gout Gout is an inflammatory arthritis caused by a buildup of uric acid crystals in the joints. Uric acid is a chemical that is normally present in the blood. When the level of uric acid in the blood is too high it can form crystals that deposit in your joints and tissues. This causes joint redness, soreness, and swelling (inflammation). Repeat attacks are common. Over time, uric acid crystals can form into masses (tophi) near a joint, destroying bone and causing disfigurement. Gout is treatable and often preventable. CAUSES  The disease begins with elevated levels of uric acid in the blood. Uric acid is produced by your body when it breaks down a naturally found substance called purines. Certain foods you eat, such as meats and fish, contain high amounts of purines. Causes of an elevated uric acid level include:  Being passed down from parent to child (heredity).  Diseases that cause increased uric acid production (such as obesity, psoriasis, and certain cancers).  Excessive alcohol use.  Diet, especially diets rich in meat and seafood.  Medicines, including certain cancer-fighting medicines (chemotherapy), water pills (diuretics), and aspirin.  Chronic kidney disease. The kidneys are no longer able to remove uric acid well.  Problems with metabolism. Conditions strongly associated with gout include:  Obesity.  High blood pressure.  High cholesterol.  Diabetes. Not everyone with elevated uric acid levels gets gout. It is not understood why some people get gout and others do not. Surgery, joint injury, and eating too much of certain foods are some of the factors that can lead to gout attacks. SYMPTOMS   An attack of gout comes on quickly. It causes intense pain with redness, swelling, and warmth in a joint.  Fever can occur.  Often, only one joint is involved. Certain joints are more commonly involved:  Base of the big toe.  Knee.  Ankle.  Wrist.  Finger. Without  treatment, an attack usually goes away in a few days to weeks. Between attacks, you usually will not have symptoms, which is different from many other forms of arthritis. DIAGNOSIS  Your caregiver will suspect gout based on your symptoms and exam. In some cases, tests may be recommended. The tests may include:  Blood tests.  Urine tests.  X-rays.  Joint fluid exam. This exam requires a needle to remove fluid from the joint (arthrocentesis). Using a microscope, gout is confirmed when uric acid crystals are seen in the joint fluid. TREATMENT  There are two phases to gout treatment: treating the sudden onset (acute) attack and preventing attacks (prophylaxis).  Treatment of an Acute Attack.  Medicines are used. These include anti-inflammatory medicines or steroid medicines.  An injection of steroid medicine into the affected joint is sometimes necessary.  The painful joint is rested. Movement can worsen the arthritis.  You may use warm or cold treatments on painful joints, depending which works best for you.  Treatment to Prevent Attacks.  If you suffer from frequent gout attacks, your caregiver may advise preventive medicine. These medicines are started after the acute attack subsides. These medicines either help your kidneys eliminate uric acid from your body or decrease your uric acid production. You may need to stay on these medicines for a very long time.  The early phase of treatment with preventive medicine can be associated with an increase in acute gout attacks. For this reason, during the first few months of treatment, your caregiver may also advise you to take medicines usually used for acute gout treatment. Be sure you   understand your caregiver's directions. Your caregiver may make several adjustments to your medicine dose before these medicines are effective.  Discuss dietary treatment with your caregiver or dietitian. Alcohol and drinks high in sugar and fructose and foods  such as meat, poultry, and seafood can increase uric acid levels. Your caregiver or dietitian can advise you on drinks and foods that should be limited. HOME CARE INSTRUCTIONS   Do not take aspirin to relieve pain. This raises uric acid levels.  Only take over-the-counter or prescription medicines for pain, discomfort, or fever as directed by your caregiver.  Rest the joint as much as possible. When in bed, keep sheets and blankets off painful areas.  Keep the affected joint raised (elevated).  Apply warm or cold treatments to painful joints. Use of warm or cold treatments depends on which works best for you.  Use crutches if the painful joint is in your leg.  Drink enough fluids to keep your urine clear or pale yellow. This helps your body get rid of uric acid. Limit alcohol, sugary drinks, and fructose drinks.  Follow your dietary instructions. Pay careful attention to the amount of protein you eat. Your daily diet should emphasize fruits, vegetables, whole grains, and fat-free or low-fat milk products. Discuss the use of coffee, vitamin C, and cherries with your caregiver or dietitian. These may be helpful in lowering uric acid levels.  Maintain a healthy body weight. SEEK MEDICAL CARE IF:   You develop diarrhea, vomiting, or any side effects from medicines.  You do not feel better in 24 hours, or you are getting worse. SEEK IMMEDIATE MEDICAL CARE IF:   Your joint becomes suddenly more tender, and you have chills or a fever. MAKE SURE YOU:   Understand these instructions.  Will watch your condition.  Will get help right away if you are not doing well or get worse. Document Released: 07/24/2000 Document Revised: 12/11/2013 Document Reviewed: 03/09/2012 [employer] Patient Information 2015 [employer]. This information is not intended to replace advice given to you by your health care provider. Make sure you discuss any questions you have with your health care  provider. Low-Purine Diet Purines are compounds that affect the level of uric acid in your body. A low-purine diet is a diet that is low in purines. Eating a low-purine diet can prevent the level of uric acid in your body from getting too high and causing gout or kidney stones or both. WHAT DO I NEED TO KNOW ABOUT THIS DIET?  Choose low-purine foods. Examples of low-purine foods are listed in the next section.  Drink plenty of fluids, especially water. Fluids can help remove uric acid from your body. Try to drink 8-16 cups (1.9-3.8 L) a day.  Limit foods high in fat, especially saturated fat, as fat makes it harder for the body to get rid of uric acid. Foods high in saturated fat include pizza, cheese, ice cream, whole milk, fried foods, and gravies. Choose foods that are lower in fat and lean sources of protein. Use olive oil when cooking as it contains healthy fats that are not high in saturated fat.  Limit alcohol. Alcohol interferes with the elimination of uric acid from your body. If you are having a gout attack, avoid all alcohol.  Keep in mind that different people's bodies react differently to different foods. You will probably learn over time which foods do or do not affect you. If you discover that a food tends to cause your gout to flare up,   avoid eating that food. You can more freely enjoy foods that do not cause problems. If you have any questions about a food item, talk to your dietitian or health care provider. WHICH FOODS ARE LOW, MODERATE, AND HIGH IN PURINES? The following is a list of foods that are low, moderate, and high in purines. You can eat any amount of the foods that are low in purines. You may be able to have small amounts of foods that are moderate in purines. Ask your health care provider how much of a food moderate in purines you can have. Avoid foods high in purines. Grains  Foods low in purines: Enriched white bread, pasta, rice, cake, cornbread, popcorn.  Foods  moderate in purines: Whole-grain breads and cereals, wheat germ, bran, oatmeal. Uncooked oatmeal. Dry wheat bran or wheat germ.  Foods high in purines: Pancakes, French toast, biscuits, muffins. Vegetables  Foods low in purines: All vegetables, except those that are moderate in purines.  Foods moderate in purines: Asparagus, cauliflower, spinach, mushrooms, green peas. Fruits  All fruits are low in purines. Meats and other Protein Foods  Foods low in purines: Eggs, nuts, peanut butter.  Foods moderate in purines: 80-90% lean beef, lamb, veal, pork, poultry, fish, eggs, peanut butter, nuts. Crab, lobster, oysters, and shrimp. Cooked dried beans, peas, and lentils.  Foods high in purines: Anchovies, sardines, herring, mussels, tuna, codfish, scallops, trout, and haddock. Bacon. Organ meats (such as liver or kidney). Tripe. Game meat. Goose. Sweetbreads. Dairy  All dairy foods are low in purines. Low-fat and fat-free dairy products are best because they are low in saturated fat. Beverages  Drinks low in purines: Water, carbonated beverages, tea, coffee, cocoa.  Drinks moderate in purines: Soft drinks and other drinks sweetened with high-fructose corn syrup. Juices. To find whether a food or drink is sweetened with high-fructose corn syrup, look at the ingredients list.  Drinks high in purines: Alcoholic beverages (such as beer). Condiments  Foods low in purines: Salt, herbs, olives, pickles, relishes, vinegar.  Foods moderate in purines: Butter, margarine, oils, mayonnaise. Fats and Oils  Foods low in purines: All types, except gravies and sauces made with meat.  Foods high in purines: Gravies and sauces made with meat. Other Foods  Foods low in purines: Sugars, sweets, gelatin. Cake. Soups made without meat.  Foods moderate in purines: Meat-based or fish-based soups, broths, or bouillons. Foods and drinks sweetened with high-fructose corn syrup.  Foods high in purines:  High-fat desserts (such as ice cream, cookies, cakes, pies, doughnuts, and chocolate). Contact your dietitian for more information on foods that are not listed here. Document Released: 11/21/2010 Document Revised: 08/01/2013 Document Reviewed: 07/03/2013 [employer] Patient Information 2015 [employer]. This information is not intended to replace advice given to you by your health care provider. Make sure you discuss any questions you have with your health care provider.  

## 2014-09-14 NOTE — Telephone Encounter (Signed)
appt scheduled for appt today.

## 2014-09-17 ENCOUNTER — Other Ambulatory Visit: Payer: Self-pay | Admitting: Internal Medicine

## 2014-09-28 ENCOUNTER — Encounter: Payer: Self-pay | Admitting: Internal Medicine

## 2014-09-28 ENCOUNTER — Ambulatory Visit (INDEPENDENT_AMBULATORY_CARE_PROVIDER_SITE_OTHER): Payer: 59 | Admitting: Internal Medicine

## 2014-09-28 VITALS — BP 108/64 | Temp 98.5°F | Ht 70.0 in | Wt 227.0 lb

## 2014-09-28 DIAGNOSIS — Z0000 Encounter for general adult medical examination without abnormal findings: Secondary | ICD-10-CM

## 2014-09-28 DIAGNOSIS — E790 Hyperuricemia without signs of inflammatory arthritis and tophaceous disease: Secondary | ICD-10-CM

## 2014-09-28 DIAGNOSIS — M1A00X0 Idiopathic chronic gout, unspecified site, without tophus (tophi): Secondary | ICD-10-CM

## 2014-09-28 NOTE — Progress Notes (Signed)
Subjective:    Patient ID: [name] [name], male    DOB: 1956-03-08, 59 y.o.   MRN: [id number]  HPI 59 year old patient seen today for a wellness exam Medical problems include chronic recurrent gouty arthritis.  He was seen earlier in the month with acute gout, which has resolved.  He is now on a better diet and there has been some nice weight loss over the past couple of weeks.  Family history father died at 80, complications of lung cancer Mother died age 5 history of breast cancer One brother with hypertension and diabetes  Past Medical History  Diagnosis Date  . Hx of gout   . Hx of sinusitis   . Inflammation of foot due to trauma     History   Social History  . Marital Status: Married    Spouse Name: N/A  . Number of Children: N/A  . Years of Education: N/A   Occupational History  . Not on file.   Social History Main Topics  . Smoking status: Former Research scientist (life sciences)  . Smokeless tobacco: Never Used  . Alcohol Use: 0.5 oz/week    1 drink(s) per week  . Drug Use: No  . Sexual Activity: Yes   Other Topics Concern  . Not on file   Social History Narrative    Past Surgical History  Procedure Laterality Date  . Hernia repair    . Foot surgery  1992  . Neck surgery  2005  . Back surgery  2005  . Knee arthroscopy  2003  . Fusion c6-7      dr Larose Hires  . Spine surgery    . Lumber fusion      dr cabel    No family history on file.  Allergies  Allergen Reactions  . Hydrocodone-Acetaminophen     REACTION: unspecified  . Sulfamethoxazole     REACTION: unspecified    Current Outpatient Prescriptions on File Prior to Visit  Medication Sig Dispense Refill  . allopurinol (ZYLOPRIM) 300 MG tablet Take 1 tablet (300 mg total) by mouth daily. 90 tablet 0  . buPROPion (WELLBUTRIN XL) 300 MG 24 hr tablet TAKE 1 TABLET (300 MG TOTAL) BY MOUTH DAILY. 90 tablet 0  . meloxicam (MOBIC) 15 MG tablet Take 15 mg by mouth daily.    . traMADol (ULTRAM) 50 MG tablet Take 1 tablet  (50 mg total) by mouth every 8 (eight) hours as needed for pain. (Patient not taking: Reported on 09/28/2014) 30 tablet 0   No current facility-administered medications on file prior to visit.    BP 108/64 mmHg  Temp(Src) 98.5 F (36.9 C)  Ht 5\' 10"  (1.778 m)  Wt 227 lb (102.967 kg)  BMI 32.57 kg/m2      Review of Systems  Constitutional: Negative for fever, chills, activity change, appetite change and fatigue.  HENT: Negative for congestion, dental problem, ear pain, hearing loss, mouth sores, rhinorrhea, sinus pressure, sneezing, tinnitus, trouble swallowing and voice change.   Eyes: Negative for photophobia, pain, redness and visual disturbance.  Respiratory: Negative for apnea, cough, choking, chest tightness, shortness of breath and wheezing.   Cardiovascular: Negative for chest pain, palpitations and leg swelling.  Gastrointestinal: Negative for nausea, vomiting, abdominal pain, diarrhea, constipation, blood in stool, abdominal distention, anal bleeding and rectal pain.  Genitourinary: Negative for dysuria, urgency, frequency, hematuria, flank pain, decreased urine volume, discharge, penile swelling, scrotal swelling, difficulty urinating, genital sores and testicular pain.  Musculoskeletal: Positive for arthralgias. Negative for myalgias, back pain,  joint swelling, gait problem, neck pain and neck stiffness.  Skin: Negative for color change, rash and wound.  Neurological: Negative for dizziness, tremors, seizures, syncope, facial asymmetry, speech difficulty, weakness, light-headedness, numbness and headaches.  Hematological: Negative for adenopathy. Does not bruise/bleed easily.  Psychiatric/Behavioral: Negative for suicidal ideas, hallucinations, behavioral problems, confusion, sleep disturbance, self-injury, dysphoric mood, decreased concentration and agitation. The patient is not nervous/anxious.        Objective:   Physical Exam  Constitutional: He appears well-developed  and well-nourished.  Overweight Blood pressure low normal  HENT:  Head: Normocephalic and atraumatic.  Right Ear: External ear normal.  Left Ear: External ear normal.  Nose: Nose normal.  Mouth/Throat: Oropharynx is clear and moist.  Eyes: Conjunctivae and EOM are normal. Pupils are equal, round, and reactive to light. No scleral icterus.  Neck: Normal range of motion. Neck supple. No JVD present. No thyromegaly present.  Cardiovascular: Regular rhythm, normal heart sounds and intact distal pulses.  Exam reveals no gallop and no friction rub.   No murmur heard. Pulmonary/Chest: Effort normal and breath sounds normal. He exhibits no tenderness.  Abdominal: Soft. Bowel sounds are normal. He exhibits no distension and no mass. There is no tenderness.  Genitourinary: Prostate normal and penis normal. Guaiac negative stool.  Musculoskeletal: Normal range of motion. He exhibits no edema or tenderness.  Lymphadenopathy:    He has no cervical adenopathy.  Neurological: He is alert. He has normal reflexes. No cranial nerve deficit. Coordination normal.  Skin: Skin is warm and dry. No rash noted.  Psychiatric: He has a normal mood and affect. His behavior is normal.          Assessment & Plan:  Preventive health examination History recurrent gout.  We'll continue better diet and efforts at weight loss.  Continue allopurinol  Laboratory studies reviewed Colonoscopy.  Encouraged, but declined at this time  Recheck one year

## 2014-09-28 NOTE — Progress Notes (Signed)
   Subjective:    Patient ID: [name] [name], male    DOB: [birth year], 59 y.o.   MRN: [id number]  HPI  Wt Readings from Last 3 Encounters:  09/28/14 227 lb (102.967 kg)  09/14/14 233 lb (105.688 kg)  08/07/14 237 lb 1.6 oz (107.548 kg)    Review of Systems     Objective:   Physical Exam        Assessment & Plan:

## 2014-09-28 NOTE — Patient Instructions (Addendum)
Limit your sodium (Salt) intake    It is important that you exercise regularly, at least 20 minutes 3 to 4 times per week.  If you develop chest pain or shortness of breath seek  medical attention.  Return in one year for follow-up   Schedule your colonoscopy to help detect colon cancer.  Health Maintenance A healthy lifestyle and preventative care can promote health and wellness.  Maintain regular health, dental, and eye exams.  Eat a healthy diet. Foods like vegetables, fruits, whole grains, low-fat dairy products, and lean protein foods contain the nutrients you need and are low in calories. Decrease your intake of foods high in solid fats, added sugars, and salt. Get information about a proper diet from your health care provider, if necessary.  Regular physical exercise is one of the most important things you can do for your health. Most adults should get at least 150 minutes of moderate-intensity exercise (any activity that increases your heart rate and causes you to sweat) each week. In addition, most adults need muscle-strengthening exercises on 2 or more days a week.   Maintain a healthy weight. The body mass index (BMI) is a screening tool to identify possible weight problems. It provides an estimate of body fat based on height and weight. Your health care provider can find your BMI and can help you achieve or maintain a healthy weight. For males 20 years and older:  A BMI below 18.5 is considered underweight.  A BMI of 18.5 to 24.9 is normal.  A BMI of 25 to 29.9 is considered overweight.  A BMI of 30 and above is considered obese.  Maintain normal blood lipids and cholesterol by exercising and minimizing your intake of saturated fat. Eat a balanced diet with plenty of fruits and vegetables. Blood tests for lipids and cholesterol should begin at age 56 and be repeated every 5 years. If your lipid or cholesterol levels are high, you are over age 62, or you are at high risk for  heart disease, you may need your cholesterol levels checked more frequently.Ongoing high lipid and cholesterol levels should be treated with medicines if diet and exercise are not working.  If you smoke, find out from your health care provider how to quit. If you do not use tobacco, do not start.  Lung cancer screening is recommended for adults aged 65-80 years who are at high risk for developing lung cancer because of a history of smoking. A yearly low-dose CT scan of the lungs is recommended for people who have at least a 30-pack-year history of smoking and are current smokers or have quit within the past 15 years. A pack year of smoking is smoking an average of 1 pack of cigarettes a day for 1 year (for example, a 30-pack-year history of smoking could mean smoking 1 pack a day for 30 years or 2 packs a day for 15 years). Yearly screening should continue until the smoker has stopped smoking for at least 15 years. Yearly screening should be stopped for people who develop a health problem that would prevent them from having lung cancer treatment.  If you choose to drink alcohol, do not have more than 2 drinks per day. One drink is considered to be 12 oz (360 mL) of beer, 5 oz (150 mL) of wine, or 1.5 oz (45 mL) of liquor.  Avoid the use of street drugs. Do not share needles with anyone. Ask for help if you need support or instructions about stopping  the use of drugs.  High blood pressure causes heart disease and increases the risk of stroke. Blood pressure should be checked at least every 1-2 years. Ongoing high blood pressure should be treated with medicines if weight loss and exercise are not effective.  If you are 1-62 years old, ask your health care provider if you should take aspirin to prevent heart disease.  Diabetes screening involves taking a blood sample to check your fasting blood sugar level. This should be done once every 3 years after age 57 if you are at a normal weight and without  risk factors for diabetes. Testing should be considered at a younger age or be carried out more frequently if you are overweight and have at least 1 risk factor for diabetes.  Colorectal cancer can be detected and often prevented. Most routine colorectal cancer screening begins at the age of 47 and continues through age 45. However, your health care provider may recommend screening at an earlier age if you have risk factors for colon cancer. On a yearly basis, your health care provider may provide home test kits to check for hidden blood in the stool. A small camera at the end of a tube may be used to directly examine the colon (sigmoidoscopy or colonoscopy) to detect the earliest forms of colorectal cancer. Talk to your health care provider about this at age 53 when routine screening begins. A direct exam of the colon should be repeated every 5-10 years through age 20, unless early forms of precancerous polyps or small growths are found.  People who are at an increased risk for hepatitis B should be screened for this virus. You are considered at high risk for hepatitis B if:  You were born in a country where hepatitis B occurs often. Talk with your health care provider about which countries are considered high risk.  Your parents were born in a high-risk country and you have not received a shot to protect against hepatitis B (hepatitis B vaccine).  You have HIV or AIDS.  You use needles to inject street drugs.  You live with, or have sex with, someone who has hepatitis B.  You are a man who has sex with other men (MSM).  You get hemodialysis treatment.  You take certain medicines for conditions like cancer, organ transplantation, and autoimmune conditions.  Hepatitis C blood testing is recommended for all people born from [birth year] through 1964 and any individual with known risk factors for hepatitis C.  Healthy men should no longer receive prostate-specific antigen (PSA) blood tests as part of  routine cancer screening. Talk to your health care provider about prostate cancer screening.  Testicular cancer screening is not recommended for adolescents or adult males who have no symptoms. Screening includes self-exam, a health care provider exam, and other screening tests. Consult with your health care provider about any symptoms you have or any concerns you have about testicular cancer.  Practice safe sex. Use condoms and avoid high-risk sexual practices to reduce the spread of sexually transmitted infections (STIs).  You should be screened for STIs, including gonorrhea and chlamydia if:  You are sexually active and are younger than 24 years.  You are older than 24 years, and your health care provider tells you that you are at risk for this type of infection.  Your sexual activity has changed since you were last screened, and you are at an increased risk for chlamydia or gonorrhea. Ask your health care provider if you are at  risk.  If you are at risk of being infected with HIV, it is recommended that you take a prescription medicine daily to prevent HIV infection. This is called pre-exposure prophylaxis (PrEP). You are considered at risk if:  You are a man who has sex with other men (MSM).  You are a heterosexual man who is sexually active with multiple partners.  You take drugs by injection.  You are sexually active with a partner who has HIV.  Talk with your health care provider about whether you are at high risk of being infected with HIV. If you choose to begin PrEP, you should first be tested for HIV. You should then be tested every 3 months for as long as you are taking PrEP.  Use sunscreen. Apply sunscreen liberally and repeatedly throughout the day. You should seek shade when your shadow is shorter than you. Protect yourself by wearing long sleeves, pants, a wide-brimmed hat, and sunglasses year round whenever you are outdoors.  Tell your health care provider of new moles  or changes in moles, especially if there is a change in shape or color. Also, tell your health care provider if a mole is larger than the size of a pencil eraser.  A one-time screening for abdominal aortic aneurysm (AAA) and surgical repair of large AAAs by ultrasound is recommended for men aged 73-75 years who are current or former smokers.  Stay current with your vaccines (immunizations). Document Released: 01/23/2008 Document Revised: 08/01/2013 Document Reviewed: 12/22/2010 [facility] Patient Information 2015 [place], [place]. This information is not intended to replace advice given to you by your health care provider. Make sure you discuss any questions you have with your health care provider.

## 2014-12-27 ENCOUNTER — Other Ambulatory Visit: Payer: Self-pay | Admitting: Internal Medicine

## 2015-01-22 ENCOUNTER — Telehealth: Payer: Self-pay | Admitting: Internal Medicine

## 2015-01-22 MED ORDER — BUPROPION HCL ER (XL) 300 MG PO TB24: ORAL_TABLET | ORAL | Status: DC

## 2015-01-22 NOTE — Telephone Encounter (Signed)
Rx sent to pharmacy   

## 2015-01-22 NOTE — Telephone Encounter (Signed)
Pt request refill buPROPion (WELLBUTRIN XL) 300 MG 24 hr tablet 90 day  [facility] [address]  Pt is interested in getting this dose reduced in a couple of months.  Pt would like to discuss,and made appt for  8/19.

## 2015-03-29 ENCOUNTER — Ambulatory Visit: Payer: 59 | Admitting: Internal Medicine

## 2015-04-02 ENCOUNTER — Ambulatory Visit (INDEPENDENT_AMBULATORY_CARE_PROVIDER_SITE_OTHER): Payer: BLUE CROSS/BLUE SHIELD | Admitting: Internal Medicine

## 2015-04-02 ENCOUNTER — Encounter: Payer: Self-pay | Admitting: Internal Medicine

## 2015-04-02 VITALS — BP 118/80 | HR 80 | Temp 98.2°F | Resp 20 | Ht 70.0 in | Wt 238.0 lb

## 2015-04-02 DIAGNOSIS — M1A00X0 Idiopathic chronic gout, unspecified site, without tophus (tophi): Secondary | ICD-10-CM

## 2015-04-02 DIAGNOSIS — R413 Other amnesia: Secondary | ICD-10-CM | POA: Diagnosis not present

## 2015-04-02 MED ORDER — PREDNISONE 20 MG PO TABS: 20.0000 mg | ORAL_TABLET | Freq: Two times a day (BID) | ORAL | Status: DC

## 2015-04-02 NOTE — Progress Notes (Signed)
Subjective:    Patient ID: [name] [name], male    DOB: 1956-03-03, 59 y.o.   MRN: [id number]  HPI 59 year old patient who has a history of recurrent gout.  He presents with a 3-4 day history of pain involving his right hand.  He admits to dietary indiscretion but has been compliant with daily allopurinol. His main concern is memory loss.  He feels that for the past 6-8 months.  He has had some memory deficits.  He has a very difficult time remembering measurements.  He states his wife is also noted some changes  MMSE 29/30- missed one question on serial sevens  He has been on Wellbutrin  Past Medical History  Diagnosis Date  . Hx of gout   . Hx of sinusitis   . Inflammation of foot due to trauma     Social History   Social History  . Marital Status: Married    Spouse Name: N/A  . Number of Children: N/A  . Years of Education: N/A   Occupational History  . Not on file.   Social History Main Topics  . Smoking status: Former Research scientist (life sciences)  . Smokeless tobacco: Never Used  . Alcohol Use: 0.5 oz/week    1 drink(s) per week  . Drug Use: No  . Sexual Activity: Yes   Other Topics Concern  . Not on file   Social History Narrative    Past Surgical History  Procedure Laterality Date  . Hernia repair    . Foot surgery  1992  . Neck surgery  2005  . Back surgery  2005  . Knee arthroscopy  2003  . Fusion c6-7      dr Larose Hires  . Spine surgery    . Lumber fusion      dr cabel    No family history on file.  Allergies  Allergen Reactions  . Hydrocodone-Acetaminophen     REACTION: unspecified  . Sulfamethoxazole     REACTION: unspecified    Current Outpatient Prescriptions on File Prior to Visit  Medication Sig Dispense Refill  . allopurinol (ZYLOPRIM) 300 MG tablet TAKE 1 TABLET (300 MG TOTAL) BY MOUTH DAILY. 90 tablet 3  . buPROPion (WELLBUTRIN XL) 300 MG 24 hr tablet TAKE 1 TABLET (300 MG TOTAL) BY MOUTH DAILY. 90 tablet 1  . meloxicam (MOBIC) 15 MG tablet Take 15 mg  by mouth daily.    . traMADol (ULTRAM) 50 MG tablet Take 1 tablet (50 mg total) by mouth every 8 (eight) hours as needed for pain. (Patient not taking: Reported on 09/28/2014) 30 tablet 0   No current facility-administered medications on file prior to visit.    BP 118/80 mmHg  Pulse 80  Temp(Src) 98.2 F (36.8 C) (Oral)  Resp 20  Ht 5\' 10"  (1.778 m)  Wt 238 lb (107.956 kg)  BMI 34.15 kg/m2  SpO2 97%      Review of Systems  Constitutional: Negative for fever, chills, appetite change and fatigue.  HENT: Negative for congestion, dental problem, ear pain, hearing loss, sore throat, tinnitus, trouble swallowing and voice change.   Eyes: Negative for pain, discharge and visual disturbance.  Respiratory: Negative for cough, chest tightness, wheezing and stridor.   Cardiovascular: Negative for chest pain, palpitations and leg swelling.  Gastrointestinal: Negative for nausea, vomiting, abdominal pain, diarrhea, constipation, blood in stool and abdominal distention.  Genitourinary: Negative for urgency, hematuria, flank pain, discharge, difficulty urinating and genital sores.  Musculoskeletal: Positive for joint swelling and arthralgias. Negative  for myalgias, back pain, gait problem and neck stiffness.  Skin: Negative for rash.  Neurological: Negative for dizziness, syncope, speech difficulty, weakness, numbness and headaches.  Hematological: Negative for adenopathy. Does not bruise/bleed easily.  Psychiatric/Behavioral: Positive for confusion and decreased concentration. Negative for behavioral problems and dysphoric mood. The patient is not nervous/anxious.        Objective:   Physical Exam  Constitutional: He appears well-developed and well-nourished. No distress.  Musculoskeletal:  Soft tissue swelling.  Maximal about the right third MCP joint  Neurological:  MMSE 29/30.  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   Will treat with 7 days of oral prednisone History memory loss.  Normal MMSE.  We'll taper and discontinue Wellbutrin and follow

## 2015-04-02 NOTE — Progress Notes (Signed)
Pre visit review using our clinic review tool, if applicable. No additional management support is needed unless otherwise documented below in the visit note. 

## 2015-04-02 NOTE — Patient Instructions (Signed)
Gout °Gout is when your joints become red, sore, and swell (inflamed). This is caused by the buildup of uric acid crystals in the joints. Uric acid is a chemical that is normally in the blood. If the level of uric acid gets too high in the blood, these crystals form in your joints and tissues. Over time, these crystals can form into masses near the joints and tissues. These masses can destroy bone and cause the bone to look misshapen (deformed). °HOME CARE  °· Do not take aspirin for pain. °· Only take medicine as told by your doctor. °· Rest the joint as much as you can. When in bed, keep sheets and blankets off painful areas. °· Keep the sore joints raised (elevated). °· Put warm or cold packs on painful joints. Use of warm or cold packs depends on which works best for you. °· Use crutches if the painful joint is in your leg. °· Drink enough fluids to keep your pee (urine) clear or pale yellow. Limit alcohol, sugary drinks, and drinks with fructose in them. °· Follow your diet instructions. Pay careful attention to how much protein you eat. Include fruits, vegetables, whole grains, and fat-free or low-fat milk products in your daily diet. Talk to your doctor or dietitian about the use of coffee, vitamin C, and cherries. These may help lower uric acid levels. °· Keep a healthy body weight. °GET HELP RIGHT AWAY IF:  °· You have watery poop (diarrhea), throw up (vomit), or have any side effects from medicines. °· You do not feel better in 24 hours, or you are getting worse. °· Your joint becomes suddenly more tender, and you have chills or a fever. °MAKE SURE YOU:  °· Understand these instructions. °· Will watch your condition. °· Will get help right away if you are not doing well or get worse. °Document Released: 05/05/2008 Document Revised: 12/11/2013 Document Reviewed: 03/09/2012 °[employer]® Patient Information ©2015 [employer]. This information is not intended to replace advice given to you by your health care  provider. Make sure you discuss any questions you have with your health care provider. ° °

## 2016-03-27 ENCOUNTER — Encounter: Payer: Self-pay | Admitting: Internal Medicine

## 2016-03-27 ENCOUNTER — Ambulatory Visit (INDEPENDENT_AMBULATORY_CARE_PROVIDER_SITE_OTHER): Payer: Managed Care, Other (non HMO) | Admitting: Internal Medicine

## 2016-03-27 ENCOUNTER — Telehealth: Payer: Self-pay | Admitting: Internal Medicine

## 2016-03-27 DIAGNOSIS — F339 Major depressive disorder, recurrent, unspecified: Secondary | ICD-10-CM | POA: Diagnosis not present

## 2016-03-27 DIAGNOSIS — F411 Generalized anxiety disorder: Secondary | ICD-10-CM | POA: Diagnosis not present

## 2016-03-27 MED ORDER — BUPROPION HCL ER (XL) 150 MG PO TB24: 150.0000 mg | ORAL_TABLET | Freq: Every day | ORAL | 0 refills | Status: DC

## 2016-03-27 NOTE — Telephone Encounter (Signed)
[facility] - Client [place] Patient Name: MAYER LAKATOS DOB: 08-17-1955 Initial Comment Caller states her husband is having some emotional issues. Anxiety and depression. Nurse Assessment Nurse: Dimas Chyle, RN, Dellis Filbert Date/Time Eilene Ghazi Time): 03/27/2016 9:35:37 AM Confirm and document reason for call. If symptomatic, describe symptoms. You must click the next button to save text entered. ---Caller states her husband is having some emotional issues. Anxiety and depression. Crying last night and this morning. Has the patient traveled out of the country within the last 30 days? ---No Does the patient have any new or worsening symptoms? ---Yes Will a triage be completed? ---Yes Related visit to physician within the last 2 weeks? ---No Does the PT have any chronic conditions? (i.e. diabetes, asthma, etc.) ---No Is this a behavioral health or substance abuse call? ---No Guidelines Guideline Title Affirmed Question Affirmed Notes Depression [1] Depression AND [2] worsening (e.g.,sleeping poorly, less able to do activities of daily living) Final Disposition User See Physician within 24 Hours Dimas Chyle, RN, [employer] Referrals REFERRED TO PCP OFFICE Disagree/Comply: Leta Baptist

## 2016-03-27 NOTE — Patient Instructions (Addendum)
Restart the Wellbutrin cause it helped you  Before but   Ssri group such as zoloft  celexa   may be better for  Anxiety  ptsd sx.   Also  Bipolar depression  May need different  Type of med   Avoid alcohol because  It is a depressant .   Advise  Behavioral health  appt  [phone] Or  Other    [facility] or   Other .  And FU visit with dr Raliegh Ip in 2 weeks or as needed. Seek emergent care if worsening in the interim   When you next have  Lab check up with dr Raliegh Ip   Can do hep c and hiv aby screening as per guidelines.    Major Depressive Disorder Major depressive disorder is a mental illness. It also may be called clinical depression or unipolar depression. Major depressive disorder usually causes feelings of sadness, hopelessness, or helplessness. Some people with this disorder do not feel particularly sad but lose interest in doing things they used to enjoy (anhedonia). Major depressive disorder also can cause physical symptoms. It can interfere with work, school, relationships, and other normal everyday activities. The disorder varies in severity but is longer lasting and more serious than the sadness we all feel from time to time in our lives. Major depressive disorder often is triggered by stressful life events or major life changes. Examples of these triggers include divorce, loss of your job or home, a move, and the death of a family member or close friend. Sometimes this disorder occurs for no obvious reason at all. People who have family members with major depressive disorder or bipolar disorder are at higher risk for developing this disorder, with or without life stressors. Major depressive disorder can occur at any age. It may occur just once in your life (single episode major depressive disorder). It may occur multiple times (recurrent major depressive disorder). SYMPTOMS People with major depressive disorder have either anhedonia or depressed mood on nearly a daily basis for at  least 2 weeks or longer. Symptoms of depressed mood include:  Feelings of sadness (blue or down in the dumps) or emptiness.  Feelings of hopelessness or helplessness.  Tearfulness or episodes of crying (may be observed by others).  Irritability (children and adolescents). In addition to depressed mood or anhedonia or both, people with this disorder have at least four of the following symptoms:  Difficulty sleeping or sleeping too much.   Significant change (increase or decrease) in appetite or weight.   Lack of energy or motivation.  Feelings of guilt and worthlessness.   Difficulty concentrating, remembering, or making decisions.  Unusually slow movement (psychomotor retardation) or restlessness (as observed by others).   Recurrent wishes for death, recurrent thoughts of self-harm (suicide), or a suicide attempt. People with major depressive disorder commonly have persistent negative thoughts about themselves, other people, and the world. People with severe major depressive disorder may experiencedistorted beliefs or perceptions about the world (psychotic delusions). They also may see or hear things that are not real (psychotic hallucinations). DIAGNOSIS Major depressive disorder is diagnosed through an assessment by your health care provider. Your health care provider will ask aboutaspects of your daily life, such as mood,sleep, and appetite, to see if you have the diagnostic symptoms of major depressive disorder. Your health care provider may ask about your medical history and use of alcohol or drugs, including prescription medicines. Your health care provider also may do a physical exam and blood work.  This is because certain medical conditions and the use of certain substances can cause major depressive disorder-like symptoms (secondary depression). Your health care provider also may refer you to a mental health specialist for further evaluation and treatment. TREATMENT It is  important to recognize the symptoms of major depressive disorder and seek treatment. The following treatments can be prescribed for this disorder:   Medicine. Antidepressant medicines usually are prescribed. Antidepressant medicines are thought to correct chemical imbalances in the brain that are commonly associated with major depressive disorder. Other types of medicine may be added if the symptoms do not respond to antidepressant medicines alone or if psychotic delusions or hallucinations occur.  Talk therapy. Talk therapy can be helpful in treating major depressive disorder by providing support, education, and guidance. Certain types of talk therapy also can help with negative thinking (cognitive behavioral therapy) and with relationship issues that trigger this disorder (interpersonal therapy). A mental health specialist can help determine which treatment is best for you. Most people with major depressive disorder do well with a combination of medicine and talk therapy. Treatments involving electrical stimulation of the brain can be used in situations with extremely severe symptoms or when medicine and talk therapy do not work over time. These treatments include electroconvulsive therapy, transcranial magnetic stimulation, and vagal nerve stimulation.   This information is not intended to replace advice given to you by your health care provider. Make sure you discuss any questions you have with your health care provider.   Document Released: 11/21/2012 Document Revised: 08/17/2014 Document Reviewed: 11/21/2012 Elsevier Interactive Patient Education 2016 Elsevier Inc.  Generalized Anxiety Disorder Generalized anxiety disorder (GAD) is a mental disorder. It interferes with life functions, including relationships, work, and school. GAD is different from normal anxiety, which everyone experiences at some point in their lives in response to specific life events and activities. Normal anxiety actually  helps us prepare for and get through these life events and activities. Normal anxiety goes away after the event or activity is over.  GAD causes anxiety that is not necessarily related to specific events or activities. It also causes excess anxiety in proportion to specific events or activities. The anxiety associated with GAD is also difficult to control. GAD can vary from mild to severe. People with severe GAD can have intense waves of anxiety with physical symptoms (panic attacks).  SYMPTOMS The anxiety and worry associated with GAD are difficult to control. This anxiety and worry are related to many life events and activities and also occur more days than not for 6 months or longer. People with GAD also have three or more of the following symptoms (one or more in children):  Restlessness.   Fatigue.  Difficulty concentrating.   Irritability.  Muscle tension.  Difficulty sleeping or unsatisfying sleep. DIAGNOSIS GAD is diagnosed through an assessment by your health care provider. Your health care provider will ask you questions aboutyour mood,physical symptoms, and events in your life. Your health care provider may ask you about your medical history and use of alcohol or drugs, including prescription medicines. Your health care provider may also do a physical exam and blood tests. Certain medical conditions and the use of certain substances can cause symptoms similar to those associated with GAD. Your health care provider may refer you to a mental health specialist for further evaluation. TREATMENT The following therapies are usually used to treat GAD:   Medication. Antidepressant medication usually is prescribed for long-term daily control. Antianxiety medicines may be added in  severe cases, especially when panic attacks occur.   Talk therapy (psychotherapy). Certain types of talk therapy can be helpful in treating GAD by providing support, education, and guidance. A form of talk  therapy called cognitive behavioral therapy can teach you healthy ways to think about and react to daily life events and activities.  Stress managementtechniques. These include yoga, meditation, and exercise and can be very helpful when they are practiced regularly. A mental health specialist can help determine which treatment is best for you. Some people see improvement with one therapy. However, other people require a combination of therapies.   This information is not intended to replace advice given to you by your health care provider. Make sure you discuss any questions you have with your health care provider.   Document Released: 11/21/2012 Document Revised: 08/17/2014 Document Reviewed: 11/21/2012 Elsevier Interactive Patient Education [employer].

## 2016-03-27 NOTE — Telephone Encounter (Signed)
WP notified.

## 2016-03-27 NOTE — Telephone Encounter (Signed)
Please advise Appointment schedule with Dr. Regis Bill today 03/27/16 @ 11:30

## 2016-03-27 NOTE — Progress Notes (Signed)
Pre visit review using our clinic review tool, if applicable. No additional management support is needed unless otherwise documented below in the visit note. 

## 2016-03-27 NOTE — Progress Notes (Signed)
Chief Complaint  Patient presents with  . Depression    Wants to restart Wellbutrin  . Anxiety    HPI: [name] [name] 60 y.o.   Send in as emergent visit by team health  PCP NA He is here today with his wife. He has a history of depression anxiety symptoms and had done very well on Wellbutrin in the past but has not been on it for a very year. And it continued to do well until the last few months and the last week much worse. He has symptoms of depression and anxiety has medical problems that include back and neck. He feels overwhelmed at times and emotional. He is not suicidal but is definite difference. He has been communication with his wife and feels like he has self-destructive behaviors. He has history of binge type drinking last time about weeks ago which may have triggered worse mood. He is able to take 12 alcoholic beverages at a time.  He used to drink more but stopped because he was worried about his liver. 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  Some of the symptoms are like panic attacks pH Q 15 given today.  ROS: See pertinent positives and negatives per HPI. Memory  And ability motivation to things  Is getting worse with this hx of near death experience   Past Medical History:  Diagnosis Date  . Hx of gout   . Hx of sinusitis   . Inflammation of foot due to trauma    There is a family history of some depression possible other diagnoses. Alcoholic grandfather after being in the war. And having head injury. No family history on file.  Social History   Social History  . Marital status: Married    Spouse name: N/A  . Number of children: N/A  . Years of education: N/A   Social History Main Topics  . Smoking status: Former Research scientist (life sciences)  . Smokeless tobacco: Never Used  . Alcohol use 0.5 oz/week    1 drink(s) per week  . Drug use: No  . Sexual activity: Yes   Other Topics Concern  . None   Social  History Narrative  . None    Outpatient Medications Prior to Visit  Medication Sig Dispense Refill  . allopurinol (ZYLOPRIM) 300 MG tablet TAKE 1 TABLET (300 MG TOTAL) BY MOUTH DAILY. (Patient not taking: Reported on 03/27/2016) 90 tablet 3  . meloxicam (MOBIC) 15 MG tablet Take 15 mg by mouth daily.    . predniSONE (DELTASONE) 20 MG tablet Take 1 tablet (20 mg total) by mouth 2 (two) times daily with a meal. (Patient not taking: Reported on 03/27/2016) 14 tablet 0  . traMADol (ULTRAM) 50 MG tablet Take 1 tablet (50 mg total) by mouth every 8 (eight) hours as needed for pain. (Patient not taking: Reported on 03/27/2016) 30 tablet 0  . buPROPion (WELLBUTRIN XL) 300 MG 24 hr tablet TAKE 1 TABLET (300 MG TOTAL) BY MOUTH DAILY. (Patient not taking: Reported on 03/27/2016) 90 tablet 1   No facility-administered medications prior to visit.      EXAM:  BP 124/72 (BP Location: Right Arm, Patient Position: Sitting, Cuff Size: Large)   Temp 98 F (36.7 C) (Oral)   Wt 236 lb (107 kg)   BMI 33.86 kg/m   Body mass index is 33.86 kg/m.  GENERAL: vitals reviewed and listed above, alert, oriented, appears well hydrated and in no  acute distressSome increased motor activity but good eye contact insight and speech. He is here with his wife in the room. HEENT: atraumatic, conjunctiva  clear, no obvious abnormalities on inspection of external nose and ears PSYCH: pleasant and cooperative,  PHQ-SADS Somatic: 17  Ms gi mostly some fatigue GAD7: 19 with panic  PHQ9:20 not actively suicida  Difficulty : very difficult   ASSESSMENT AND PLAN:  Discussed the following assessment and plan:  Episode of recurrent major depressive disorder, unspecified depression episode severity ([place])  Anxiety state Depression anxiety  w hx of same   Off med for over a year    Advise behavioral health  And medication and fu PCP .  He is not sure he wants to go for counseling or behavioral health but his wife opinion is  important to me will consider it. Discussed options of different types of medicines and consideration of SSRI however since he reports having done so well on the Wellbutrin in the past with remission symptoms will restart this today as we know that he can tolerate it. And Fu Dr Raliegh Ip in 2 weeks   Seek emergent help for alarm sx.  -Patient advised to return or notify health care team  if symptoms worsen ,persist or new concerns arise. Total visit 66mins > 50% spent counseling and coordinating care as indicated in above note and in instructions to patient .     Patient Instructions  Restart the Wellbutrin cause it helped you  Before but   Ssri group such as zoloft  celexa   may be better for  Anxiety  ptsd sx.   Also  Bipolar depression  May need different  Type of med   Avoid alcohol because  It is a depressant .   Advise  Behavioral health  appt  [phone] Or  Other    [facility] or   Other .  And FU visit with dr Raliegh Ip in 2 weeks or as needed. Seek emergent care if worsening in the interim   When you next have  Lab check up with dr Raliegh Ip   Can do hep c and hiv aby screening as per guidelines.    Major Depressive Disorder Major depressive disorder is a mental illness. It also may be called clinical depression or unipolar depression. Major depressive disorder usually causes feelings of sadness, hopelessness, or helplessness. Some people with this disorder do not feel particularly sad but lose interest in doing things they used to enjoy (anhedonia). Major depressive disorder also can cause physical symptoms. It can interfere with work, school, relationships, and other normal everyday activities. The disorder varies in severity but is longer lasting and more serious than the sadness we all feel from time to time in our lives. Major depressive disorder often is triggered by stressful life events or major life changes. Examples of these triggers include divorce, loss of your job or home, a  move, and the death of a family member or close friend. Sometimes this disorder occurs for no obvious reason at all. People who have family members with major depressive disorder or bipolar disorder are at higher risk for developing this disorder, with or without life stressors. Major depressive disorder can occur at any age. It may occur just once in your life (single episode major depressive disorder). It may occur multiple times (recurrent major depressive disorder). SYMPTOMS People with major depressive disorder have either anhedonia or depressed mood on nearly a daily basis for at least 2 weeks or  longer. Symptoms of depressed mood include:  Feelings of sadness (blue or down in the dumps) or emptiness.  Feelings of hopelessness or helplessness.  Tearfulness or episodes of crying (may be observed by others).  Irritability (children and adolescents). In addition to depressed mood or anhedonia or both, people with this disorder have at least four of the following symptoms:  Difficulty sleeping or sleeping too much.   Significant change (increase or decrease) in appetite or weight.   Lack of energy or motivation.  Feelings of guilt and worthlessness.   Difficulty concentrating, remembering, or making decisions.  Unusually slow movement (psychomotor retardation) or restlessness (as observed by others).   Recurrent wishes for death, recurrent thoughts of self-harm (suicide), or a suicide attempt. People with major depressive disorder commonly have persistent negative thoughts about themselves, other people, and the world. People with severe major depressive disorder may experiencedistorted beliefs or perceptions about the world (psychotic delusions). They also may see or hear things that are not real (psychotic hallucinations). DIAGNOSIS Major depressive disorder is diagnosed through an assessment by your health care provider. Your health care provider will ask aboutaspects of your  daily life, such as mood,sleep, and appetite, to see if you have the diagnostic symptoms of major depressive disorder. Your health care provider may ask about your medical history and use of alcohol or drugs, including prescription medicines. Your health care provider also may do a physical exam and blood work. This is because certain medical conditions and the use of certain substances can cause major depressive disorder-like symptoms (secondary depression). Your health care provider also may refer you to a mental health specialist for further evaluation and treatment. TREATMENT It is important to recognize the symptoms of major depressive disorder and seek treatment. The following treatments can be prescribed for this disorder:   Medicine. Antidepressant medicines usually are prescribed. Antidepressant medicines are thought to correct chemical imbalances in the brain that are commonly associated with major depressive disorder. Other types of medicine may be added if the symptoms do not respond to antidepressant medicines alone or if psychotic delusions or hallucinations occur.  Talk therapy. Talk therapy can be helpful in treating major depressive disorder by providing support, education, and guidance. Certain types of talk therapy also can help with negative thinking (cognitive behavioral therapy) and with relationship issues that trigger this disorder (interpersonal therapy). A mental health specialist can help determine which treatment is best for you. Most people with major depressive disorder do well with a combination of medicine and talk therapy. Treatments involving electrical stimulation of the brain can be used in situations with extremely severe symptoms or when medicine and talk therapy do not work over time. These treatments include electroconvulsive therapy, transcranial magnetic stimulation, and vagal nerve stimulation.   This information is not intended to replace advice given to you by  your health care provider. Make sure you discuss any questions you have with your health care provider.   Document Released: 11/21/2012 Document Revised: 08/17/2014 Document Reviewed: 11/21/2012 Elsevier Interactive Patient Education 2016 Elsevier Inc.  Generalized Anxiety Disorder Generalized anxiety disorder (GAD) is a mental disorder. It interferes with life functions, including relationships, work, and school. GAD is different from normal anxiety, which everyone experiences at some point in their lives in response to specific life events and activities. Normal anxiety actually helps [place] prepare for and get through these life events and activities. Normal anxiety goes away after the event or activity is over.  GAD causes anxiety that is not  necessarily related to specific events or activities. It also causes excess anxiety in proportion to specific events or activities. The anxiety associated with GAD is also difficult to control. GAD can vary from mild to severe. People with severe GAD can have intense waves of anxiety with physical symptoms (panic attacks).  SYMPTOMS The anxiety and worry associated with GAD are difficult to control. This anxiety and worry are related to many life events and activities and also occur more days than not for 6 months or longer. People with GAD also have three or more of the following symptoms (one or more in children):  Restlessness.   Fatigue.  Difficulty concentrating.   Irritability.  Muscle tension.  Difficulty sleeping or unsatisfying sleep. DIAGNOSIS GAD is diagnosed through an assessment by your health care provider. Your health care provider will ask you questions aboutyour mood,physical symptoms, and events in your life. Your health care provider may ask you about your medical history and use of alcohol or drugs, including prescription medicines. Your health care provider may also do a physical exam and blood tests. Certain medical conditions  and the use of certain substances can cause symptoms similar to those associated with GAD. Your health care provider may refer you to a mental health specialist for further evaluation. TREATMENT The following therapies are usually used to treat GAD:   Medication. Antidepressant medication usually is prescribed for long-term daily control. Antianxiety medicines may be added in severe cases, especially when panic attacks occur.   Talk therapy (psychotherapy). Certain types of talk therapy can be helpful in treating GAD by providing support, education, and guidance. A form of talk therapy called cognitive behavioral therapy can teach you healthy ways to think about and react to daily life events and activities.  Stress managementtechniques. These include yoga, meditation, and exercise and can be very helpful when they are practiced regularly. A mental health specialist can help determine which treatment is best for you. Some people see improvement with one therapy. However, other people require a combination of therapies.   This information is not intended to replace advice given to you by your health care provider. Make sure you discuss any questions you have with your health care provider.   Document Released: 11/21/2012 Document Revised: 08/17/2014 Document Reviewed: 11/21/2012 Elsevier Interactive Patient Education 2016 [place] K. Panosh M.D.  Lab Results  Component Value Date   WBC 9.4 08/27/2014   HGB 14.2 08/27/2014   HCT 42.4 08/27/2014   PLT 296.0 08/27/2014   GLUCOSE 99 08/27/2014   CHOL 188 08/27/2014   TRIG 232.0 (H) 08/27/2014   HDL 38.60 (L) 08/27/2014   LDLDIRECT 114.0 08/27/2014   ALT 34 08/27/2014   AST 24 08/27/2014   NA 139 08/27/2014   K 4.4 08/27/2014   CL 105 08/27/2014   CREATININE 0.87 08/27/2014   BUN 14 08/27/2014   CO2 27 08/27/2014   TSH 1.56 08/27/2014   PSA 1.12 08/27/2014

## 2016-04-14 ENCOUNTER — Ambulatory Visit (INDEPENDENT_AMBULATORY_CARE_PROVIDER_SITE_OTHER): Payer: Managed Care, Other (non HMO) | Admitting: Internal Medicine

## 2016-04-14 ENCOUNTER — Encounter: Payer: Self-pay | Admitting: Internal Medicine

## 2016-04-14 VITALS — BP 120/80 | HR 91 | Temp 98.1°F | Resp 20 | Ht 70.0 in | Wt 235.0 lb

## 2016-04-14 DIAGNOSIS — F331 Major depressive disorder, recurrent, moderate: Secondary | ICD-10-CM

## 2016-04-14 DIAGNOSIS — R7302 Impaired glucose tolerance (oral): Secondary | ICD-10-CM

## 2016-04-14 DIAGNOSIS — F411 Generalized anxiety disorder: Secondary | ICD-10-CM

## 2016-04-14 LAB — POCT GLUCOSE (DEVICE FOR HOME USE): POC GLUCOSE: 100 mg/dL — AB (ref 70–99)

## 2016-04-14 MED ORDER — ESCITALOPRAM OXALATE 10 MG PO TABS: 10.0000 mg | ORAL_TABLET | Freq: Every day | ORAL | 2 refills | Status: DC

## 2016-04-14 NOTE — Patient Instructions (Addendum)
Return in 6 weeks for follow-up or sooner if poor clinical response to medication  Discontinue Wellbutrin

## 2016-04-14 NOTE — Progress Notes (Signed)
Subjective:    Patient ID: [name] [name], male    DOB: September 04, 1955, 60 y.o.   MRN: [id number]  HPI  60 year old patient who has a prior history of anxiety, depression, which has responded well to Wellbutrin in the past.  He was seen approximately 2 weeks ago with recurrent major depression and was resumed on Wellbutrin.. Complaints include anxiety, irritability, insomnia, intrusive thoughts.  He states that he has a short fuse and becomes quite anxious and agitated No suicidal ideation  Past Medical History:  Diagnosis Date  . Hx of gout   . Hx of sinusitis   . Inflammation of foot due to trauma      Social History   Social History  . Marital status: Married    Spouse name: N/A  . Number of children: N/A  . Years of education: N/A   Occupational History  . Not on file.   Social History Main Topics  . Smoking status: Former Research scientist (life sciences)  . Smokeless tobacco: Never Used  . Alcohol use 0.5 oz/week    1 drink(s) per week  . Drug use: No  . Sexual activity: Yes   Other Topics Concern  . Not on file   Social History Narrative  . No narrative on file    Past Surgical History:  Procedure Laterality Date  . back surgery  2005  . FOOT SURGERY  1992  . fusion c6-7     dr Larose Hires  . HERNIA REPAIR    . KNEE ARTHROSCOPY  2003  . lumber fusion     dr cabel  . NECK SURGERY  2005  . SPINE SURGERY      No family history on file.  Allergies  Allergen Reactions  . Hydrocodone-Acetaminophen     REACTION: unspecified  . Sulfamethoxazole     REACTION: unspecified    Current Outpatient Prescriptions on File Prior to Visit  Medication Sig Dispense Refill  . allopurinol (ZYLOPRIM) 300 MG tablet TAKE 1 TABLET (300 MG TOTAL) BY MOUTH DAILY. 90 tablet 3  . buPROPion (WELLBUTRIN XL) 150 MG 24 hr tablet Take 1 tablet (150 mg total) by mouth daily. For 3-7 days then increase to 300 mg per day 60 tablet 0   No current facility-administered medications on file prior to visit.      BP 120/80 (BP Location: Left Arm, Patient Position: Sitting, Cuff Size: Large)   Pulse 91   Temp 98.1 F (36.7 C) (Oral)   Resp 20   Ht 5\' 10"  (1.778 m)   Wt 235 lb (106.6 kg)   SpO2 97%   BMI 33.72 kg/m     Review of Systems  Constitutional: Negative for appetite change, chills, fatigue and fever.  HENT: Negative for congestion, dental problem, ear pain, hearing loss, sore throat, tinnitus, trouble swallowing and voice change.   Eyes: Negative for pain, discharge and visual disturbance.  Respiratory: Negative for cough, chest tightness, wheezing and stridor.   Cardiovascular: Negative for chest pain, palpitations and leg swelling.  Gastrointestinal: Negative for abdominal distention, abdominal pain, blood in stool, constipation, diarrhea, nausea and vomiting.  Genitourinary: Negative for difficulty urinating, discharge, flank pain, genital sores, hematuria and urgency.  Musculoskeletal: Negative for arthralgias, back pain, gait problem, joint swelling, myalgias and neck stiffness.  Skin: Negative for rash.  Neurological: Negative for dizziness, syncope, speech difficulty, weakness, numbness and headaches.  Hematological: Negative for adenopathy. Does not bruise/bleed easily.  Psychiatric/Behavioral: Positive for agitation, behavioral problems, decreased concentration, dysphoric mood and sleep disturbance.  The patient is nervous/anxious.        Objective:   Physical Exam  Constitutional: He appears well-developed and well-nourished. No distress.  Psychiatric: He has a normal mood and affect. His behavior is normal. Judgment and thought content normal.          Assessment & Plan:   Recurrent major depression.  Patient has had a nice response to Wellbutrin in the past and it has only been 2 weeks.  Options discussed including given a trial of Wellbutrin.  In view of his present symptoms, will opt to start SSRI therapy.  Behavior health referral.  Again discussed.  He  wishes to defer at this time unless there is poor clinical response.  Recheck 6 weeks  [name] [name]

## 2016-04-17 ENCOUNTER — Telehealth: Payer: Self-pay | Admitting: Internal Medicine

## 2016-04-17 NOTE — Telephone Encounter (Signed)
FYI  pt wanted dr Raliegh Ip  To know he will stay with Wellbutrin. Pt has started feeling the positive effect of med

## 2016-04-20 ENCOUNTER — Encounter: Payer: Self-pay | Admitting: Internal Medicine

## 2016-04-20 ENCOUNTER — Ambulatory Visit (INDEPENDENT_AMBULATORY_CARE_PROVIDER_SITE_OTHER): Payer: Managed Care, Other (non HMO) | Admitting: Internal Medicine

## 2016-04-20 VITALS — BP 120/80 | HR 84 | Temp 98.2°F | Resp 20 | Ht 70.0 in | Wt 235.0 lb

## 2016-04-20 DIAGNOSIS — F329 Major depressive disorder, single episode, unspecified: Secondary | ICD-10-CM

## 2016-04-20 DIAGNOSIS — F32A Depression, unspecified: Secondary | ICD-10-CM

## 2016-04-20 DIAGNOSIS — M79671 Pain in right foot: Secondary | ICD-10-CM | POA: Diagnosis not present

## 2016-04-20 MED ORDER — PREDNISONE 20 MG PO TABS: 20.0000 mg | ORAL_TABLET | Freq: Two times a day (BID) | ORAL | 0 refills | Status: DC

## 2016-04-20 MED ORDER — ALLOPURINOL 300 MG PO TABS
300.0000 mg | ORAL_TABLET | Freq: Every day | ORAL | 3 refills | Status: DC
Start: 2016-04-20 — End: 2016-11-17

## 2016-04-20 MED ORDER — BUPROPION HCL ER (XL) 300 MG PO TB24: 300.0000 mg | ORAL_TABLET | Freq: Every day | ORAL | 1 refills | Status: DC

## 2016-04-20 NOTE — Progress Notes (Signed)
   Subjective:    Patient ID: [name] [name], male    DOB: 10-25-1955, 60 y.o.   MRN: [id number]  HPI  60 year old patient who was seen last week with anxiety, depression.  He was initially decided to augment Wellbutrin with Lexapro but the patient decided to give Lexapro a bit more time.  This week he feels much improved on Wellbutrin only History complaint today is soreness involving the plantar surface of his right great toe.  He has been avoiding activities over the weekend and the pain and swelling is largely resolved.  He does have a history of gout and was concerned about gouty arthritis  Past Medical History:  Diagnosis Date  . Hx of gout   . Hx of sinusitis   . Inflammation of foot due to trauma      Social History   Social History  . Marital status: Married    Spouse name: N/A  . Number of children: N/A  . Years of education: N/A   Occupational History  . Not on file.   Social History Main Topics  . Smoking status: Former Research scientist (life sciences)  . Smokeless tobacco: Never Used  . Alcohol use 0.5 oz/week    1 drink(s) per week  . Drug use: No  . Sexual activity: Yes   Other Topics Concern  . Not on file   Social History Narrative  . No narrative on file    Past Surgical History:  Procedure Laterality Date  . back surgery  2005  . FOOT SURGERY  1992  . fusion c6-7     dr Larose Hires  . HERNIA REPAIR    . KNEE ARTHROSCOPY  2003  . lumber fusion     dr cabel  . NECK SURGERY  2005  . SPINE SURGERY      No family history on file.  Allergies  Allergen Reactions  . Hydrocodone-Acetaminophen     REACTION: unspecified  . Sulfamethoxazole     REACTION: unspecified    No current outpatient prescriptions on file prior to visit.   No current facility-administered medications on file prior to visit.     BP 120/80 (BP Location: Left Arm, Patient Position: Sitting, Cuff Size: Large)   Pulse 84   Temp 98.2 F (36.8 C) (Oral)   Resp 20   Ht 5\' 10"  (1.778 m)   Wt 235 lb  (106.6 kg)   SpO2 96%   BMI 33.72 kg/m     Review of Systems  Constitutional: Negative.   Musculoskeletal: Positive for gait problem.  Skin: Positive for wound.       Objective:   Physical Exam  Constitutional: He appears well-developed and well-nourished. No distress.  Musculoskeletal:  The patient has a modest callus over the right first MTP joint.  There was some mild surrounding tenderness and soft tissue swelling  Psychiatric: He has a normal mood and affect. His behavior is normal. Judgment and thought content normal.          Assessment & Plan:   Inflamed callus right foot.  Symptoms are quickly improving.  We'll continue present treatment, which is avoiding overuse.  Will call if unimproved History of gout.  Patient has requested some prednisone to have on hand in case of acute gout while out of town  [employer]

## 2016-04-20 NOTE — Telephone Encounter (Signed)
Dr.K, okay to send Rx for Wellbutrin XL 300 mg pt wants to stay on it.

## 2016-04-20 NOTE — Telephone Encounter (Signed)
Okay 

## 2016-04-20 NOTE — Progress Notes (Signed)
Pre visit review using our clinic review tool, if applicable. No additional management support is needed unless otherwise documented below in the visit note. 

## 2016-04-20 NOTE — Addendum Note (Signed)
Addended by: Marian Sorrow on: 04/20/2016 10:35 AM   Modules accepted: Orders

## 2016-04-20 NOTE — Patient Instructions (Signed)
Call or return to clinic prn if these symptoms worsen or fail to improve as anticipated.

## 2016-04-20 NOTE — Telephone Encounter (Signed)
Left message on voicemail Rx requested sent to pharmacy. Any questions please call office.

## 2016-04-20 NOTE — Telephone Encounter (Signed)
Pt states he will be out of the  buPROPion (WELLBUTRIN XL) 300 MG 24 hr tablet   Pt needs new rx.  Wants to stay on this.  [facility] [address]

## 2016-05-29 ENCOUNTER — Encounter: Payer: Self-pay | Admitting: Internal Medicine

## 2016-05-29 ENCOUNTER — Ambulatory Visit (INDEPENDENT_AMBULATORY_CARE_PROVIDER_SITE_OTHER): Payer: Managed Care, Other (non HMO) | Admitting: Internal Medicine

## 2016-05-29 VITALS — BP 118/78 | HR 76 | Temp 97.9°F | Resp 17 | Ht 70.7 in | Wt 237.4 lb

## 2016-05-29 DIAGNOSIS — M542 Cervicalgia: Secondary | ICD-10-CM

## 2016-05-29 DIAGNOSIS — F3340 Major depressive disorder, recurrent, in remission, unspecified: Secondary | ICD-10-CM | POA: Diagnosis not present

## 2016-05-29 NOTE — Progress Notes (Signed)
Subjective:    Patient ID: [name] [name], male    DOB: [birth year], 60 y.o.   MRN: [id number]  HPI  60 year old patient who has a prior history of a cervical fusion. For the past 2 or 3 months he has had neck pain, headache, and occasional referral of pain to the right shoulder and right hand.  Denies any motor weakness. He is also seen today for follow-up of depression.  This remains stable.  Past Medical History:  Diagnosis Date  . Hx of gout   . Hx of sinusitis   . Inflammation of foot due to trauma      Social History   Social History  . Marital status: Married    Spouse name: N/A  . Number of children: N/A  . Years of education: N/A   Occupational History  . Not on file.   Social History Main Topics  . Smoking status: Former Research scientist (life sciences)  . Smokeless tobacco: Never Used  . Alcohol use 0.5 oz/week    1 drink(s) per week  . Drug use: No  . Sexual activity: Yes   Other Topics Concern  . Not on file   Social History Narrative  . No narrative on file    Past Surgical History:  Procedure Laterality Date  . back surgery  2005  . FOOT SURGERY  1992  . fusion c6-7     dr Larose Hires  . HERNIA REPAIR    . KNEE ARTHROSCOPY  2003  . lumber fusion     dr cabel  . NECK SURGERY  2005  . SPINE SURGERY      No family history on file.  Allergies  Allergen Reactions  . Hydrocodone-Acetaminophen     REACTION: unspecified  . Sulfamethoxazole     REACTION: unspecified    Current Outpatient Prescriptions on File Prior to Visit  Medication Sig Dispense Refill  . allopurinol (ZYLOPRIM) 300 MG tablet Take 1 tablet (300 mg total) by mouth daily. 90 tablet 3  . buPROPion (WELLBUTRIN XL) 300 MG 24 hr tablet Take 1 tablet (300 mg total) by mouth daily. 90 tablet 1  . predniSONE (DELTASONE) 20 MG tablet Take 1 tablet (20 mg total) by mouth 2 (two) times daily with a meal. 12 tablet 0   No current facility-administered medications on file prior to visit.     BP 118/78 (BP  Location: Left Arm, Patient Position: Sitting, Cuff Size: Large)   Pulse 76   Temp 97.9 F (36.6 C) (Oral)   Resp 17   Ht 5' 10.7" (1.796 m)   Wt 237 lb 6.4 oz (107.7 kg)   SpO2 95%   BMI 33.39 kg/m     Review of Systems  Constitutional: Negative for appetite change, chills, fatigue and fever.  HENT: Negative for congestion, dental problem, ear pain, hearing loss, sore throat, tinnitus, trouble swallowing and voice change.   Eyes: Negative for pain, discharge and visual disturbance.  Respiratory: Negative for cough, chest tightness, wheezing and stridor.   Cardiovascular: Negative for chest pain, palpitations and leg swelling.  Gastrointestinal: Negative for abdominal distention, abdominal pain, blood in stool, constipation, diarrhea, nausea and vomiting.  Genitourinary: Negative for difficulty urinating, discharge, flank pain, genital sores, hematuria and urgency.  Musculoskeletal: Positive for neck pain and neck stiffness. Negative for arthralgias, back pain, gait problem, joint swelling and myalgias.  Skin: Negative for rash.  Neurological: Positive for headaches. Negative for dizziness, syncope, speech difficulty, weakness and numbness.  Hematological: Negative for adenopathy. Does  not bruise/bleed easily.  Psychiatric/Behavioral: Negative for behavioral problems and dysphoric mood. The patient is not nervous/anxious.        Objective:   Physical Exam  Constitutional: He appears well-developed and well-nourished. No distress.  Neck: Normal range of motion. Neck supple.  Neurological:  Right biceps reflex may have been slightly decreased.  No motor weakness           Assessment & Plan:  Depression.  Clinically stable Recurrent neck pain associated with headaches.  Right shoulder and occasional right hand discomfort.  Probable mild cervical radiculopathy.  Options discussed.  Patient will like to follow-up with his neurosurgeon.  Appointment  scheduled  [name]

## 2016-05-29 NOTE — Patient Instructions (Signed)
Neurosurgical consultation as discussed    It is important that you exercise regularly, at least 20 minutes 3 to 4 times per week.  If you develop chest pain or shortness of breath seek  medical attention.  Avoids foods high in acid such as tomatoes citrus juices, and spicy foods.  Avoid eating within two hours of lying down or before exercising.  Do not overheat.  Try smaller more frequent meals.  If symptoms persist, please return for follow-up  Return in 6 months for follow-up

## 2016-07-21 ENCOUNTER — Telehealth: Payer: Self-pay | Admitting: Internal Medicine

## 2016-07-21 NOTE — Telephone Encounter (Signed)
Pt states he would like to proceed with an increase of his buPROPion (WELLBUTRIN XL) 300 MG 24 hr tablet  Pt states he discussed with Dr Raliegh Ip and would like to try before he refills next rx  [facility] [address] [place]

## 2016-07-21 NOTE — Telephone Encounter (Signed)
Okay for increase in dose.  Okay to call in a new prescription

## 2016-07-21 NOTE — Telephone Encounter (Signed)
Please see message and advise 

## 2016-07-22 MED ORDER — BUPROPION HCL ER (XL) 300 MG PO TB24: 300.0000 mg | ORAL_TABLET | Freq: Every day | ORAL | 1 refills | Status: DC

## 2016-07-24 ENCOUNTER — Other Ambulatory Visit: Payer: Self-pay | Admitting: Internal Medicine

## 2016-10-19 ENCOUNTER — Other Ambulatory Visit: Payer: Self-pay | Admitting: Internal Medicine

## 2016-10-21 ENCOUNTER — Telehealth: Payer: Self-pay | Admitting: Internal Medicine

## 2016-10-21 NOTE — Telephone Encounter (Signed)
Please see message below... Please advise. -Paty

## 2016-10-21 NOTE — Telephone Encounter (Signed)
Pt states Dr Raliegh Ip prescribed him  escitalopram (LEXAPRO) 10 MG tablet last September 2017. Pt got it filled at the pharmacy, but did not take. Pt has been taking the  buPROPion (WELLBUTRIN XL) 300 MG 24 hr tablet  But states it is just not working for him.  Pt would like to safely switch over to the Lexapro, and would like advice on how to do this.

## 2016-10-21 NOTE — Telephone Encounter (Signed)
He can just start the Lexapro 10 mg po qd.  He does not have to stop the Wellbutrin.  In fact, these drugs are often used in combination for treating depression.  I would suggest follow up with Dr Raliegh Ip in about 3 weeks after starting the Lexapro.

## 2016-10-22 NOTE — Telephone Encounter (Signed)
Left message on voicemail to call office.  

## 2016-10-29 NOTE — Telephone Encounter (Signed)
Patient returning call to office to clarify Wellbutrin and lexapro instructions. Reviewed previous notes per MD and educated patient on Wellbutrin and Lexapro dose. Patient voiced understanding, discussed with patient that it may take 3-4 weeks for medications to be effective. Patient voiced understanding, also scheduled patient for follow up depression appt.  with Dr. Raliegh Ip in 3 weeks.

## 2016-11-17 ENCOUNTER — Encounter: Payer: Self-pay | Admitting: Adult Health

## 2016-11-17 ENCOUNTER — Ambulatory Visit (INDEPENDENT_AMBULATORY_CARE_PROVIDER_SITE_OTHER): Payer: Managed Care, Other (non HMO) | Admitting: Adult Health

## 2016-11-17 ENCOUNTER — Telehealth: Payer: Self-pay | Admitting: Internal Medicine

## 2016-11-17 VITALS — BP 124/70 | Temp 97.9°F | Wt 244.0 lb

## 2016-11-17 DIAGNOSIS — R1011 Right upper quadrant pain: Secondary | ICD-10-CM

## 2016-11-17 DIAGNOSIS — R0602 Shortness of breath: Secondary | ICD-10-CM

## 2016-11-17 NOTE — Telephone Encounter (Signed)
Patient Name: [name] [name] DOB: 1955/10/24 Initial Comment caller states he has pain in shoulder blade, shortness of breath and gall bladder issues Nurse Assessment Nurse: Jimmye Norman, RN, [place] Date/Time (Eastern Time): 11/17/2016 1:28:19 PM Confirm and document reason for call. If symptomatic, describe symptoms. ---caller states he has pain in shoulder blade, shortness of breath and gall bladder issues. states he feels vibrating in the top of his stomach, is having belching whenever he eats. symptoms have been present since lunch yesterday, intermittently- only the vibrating in the stomach is present currently. Does the patient have any new or worsening symptoms? ---Yes Will a triage be completed? ---Yes Related visit to physician within the last 2 weeks? ---No Does the PT have any chronic conditions? (i.e. diabetes, asthma, etc.) ---Yes List chronic conditions. ---osteopenia, gallbladder issues Is this a behavioral health or substance abuse call? ---No Guidelines Guideline Title Affirmed Question Affirmed Notes Shoulder Pain Patient sounds very sick or weak to the triager Final Disposition User Go to ED Now (or PCP triage) Jimmye Norman, RN, [place] Referrals GO TO FACILITY UNDECIDED Disagree/Comply: Comply

## 2016-11-17 NOTE — Addendum Note (Signed)
Addended by: Sandria Bales B on: 11/17/2016 04:14 PM   Modules accepted: Orders

## 2016-11-17 NOTE — Telephone Encounter (Signed)
Pt here for OV.

## 2016-11-17 NOTE — Telephone Encounter (Signed)
Pt needs to be seen and evaluated in ED d/t symptoms per Dorothyann Peng, NP  [facility]

## 2016-11-17 NOTE — Progress Notes (Signed)
Subjective:    Patient ID: [name] [name], male    DOB: 02-28-1956, 61 y.o.   MRN: [id number]  HPI  61 year old male who  has a past medical history of gout; sinusitis; and Inflammation of foot due to trauma. He is a patient of Dr. Raliegh Ip who I am seeing today for the first time. He presents to the office today for abdominal pain. He reports prior " gallbladder issue."   He reports that yesterday while at work he started to " feel gassy and bloated". He had some fruit and water and this caused his symptoms to become worse and abdominal pain that radiated between his shoulder blades.. He went home and started to have shortness of breath and feel as though " my heart of quivering". He woke up feeling as though his symptoms were improved until he had coffee this morning. He continues to feel as though he has abdominal pain and slight shortness of breath but his pain has nearly resolved.   Denies any n/v/d. He does not drink alcohol   Review of Systems See HPI   Past Medical History:  Diagnosis Date  . Hx of gout   . Hx of sinusitis   . Inflammation of foot due to trauma     Social History   Social History  . Marital status: Married    Spouse name: N/A  . Number of children: N/A  . Years of education: N/A   Occupational History  . Not on file.   Social History Main Topics  . Smoking status: Former Research scientist (life sciences)  . Smokeless tobacco: Never Used  . Alcohol use 0.5 oz/week    1 drink(s) per week  . Drug use: No  . Sexual activity: Yes   Other Topics Concern  . Not on file   Social History Narrative  . No narrative on file    Past Surgical History:  Procedure Laterality Date  . back surgery  2005  . FOOT SURGERY  1992  . fusion c6-7     dr Larose Hires  . HERNIA REPAIR    . KNEE ARTHROSCOPY  2003  . lumber fusion     dr cabel  . NECK SURGERY  2005  . SPINE SURGERY      No family history on file.  Allergies  Allergen Reactions  . Hydrocodone-Acetaminophen     REACTION:  unspecified  . Sulfamethoxazole     REACTION: unspecified    Current Outpatient Prescriptions on File Prior to Visit  Medication Sig Dispense Refill  . allopurinol (ZYLOPRIM) 300 MG tablet Take 1 tablet (300 mg total) by mouth daily. 90 tablet 3  . allopurinol (ZYLOPRIM) 300 MG tablet TAKE 1 TABLET (300 MG TOTAL) BY MOUTH DAILY. 90 tablet 2  . buPROPion (WELLBUTRIN XL) 300 MG 24 hr tablet Take 1 tablet (300 mg total) by mouth daily. 90 tablet 1  . buPROPion (WELLBUTRIN XL) 300 MG 24 hr tablet TAKE 1 TABLET BY MOUTH EVERY DAY 90 tablet 1  . predniSONE (DELTASONE) 20 MG tablet Take 1 tablet (20 mg total) by mouth 2 (two) times daily with a meal. 12 tablet 0   No current facility-administered medications on file prior to visit.     BP 124/70 (BP Location: Left Arm, Patient Position: Sitting, Cuff Size: Large)   Temp 97.9 F (36.6 C) (Oral)   Wt 244 lb (110.7 kg)   BMI 34.32 kg/m       Objective:   Physical Exam  Constitutional:  He is oriented to person, place, and time. He appears well-developed and well-nourished. No distress.  Cardiovascular: Normal rate, regular rhythm, normal heart sounds and intact distal pulses.  Exam reveals no gallop and no friction rub.   No murmur heard. Pulmonary/Chest: Effort normal and breath sounds normal.  Abdominal: Soft. Bowel sounds are normal. He exhibits no distension and no mass. There is no hepatosplenomegaly, splenomegaly or hepatomegaly. There is tenderness in the right upper quadrant and epigastric area. There is no rebound, no guarding and no CVA tenderness. A hernia is present. Hernia confirmed positive in the ventral area.  Neurological: He is alert and oriented to person, place, and time.  Skin: Skin is warm and dry. No rash noted. He is not diaphoretic. No erythema. No pallor.  Abdominal scars from previous surgery   Psychiatric: He has a normal mood and affect. His behavior is normal. Judgment and thought content normal.  Nursing note  and vitals reviewed.     Assessment & Plan:   1. Right upper quadrant abdominal pain - EKG 12-Lead- NSR - Rate 75 - CBC with Differential/Platelet - Comprehensive metabolic panel - H. pylori antibody, IgG - US Abdomen Complete; Future - Lipase - Advised to eat a bland diet for the next 48 hours  - Consider referral to [place], NP

## 2016-11-18 LAB — H. PYLORI ANTIBODY, IGG: H Pylori IgG: NEGATIVE

## 2016-11-18 LAB — CBC WITH DIFFERENTIAL/PLATELET
BASOS PCT: 0.8 % (ref 0.0–3.0)
Basophils Absolute: 0.1 10*3/uL (ref 0.0–0.1)
EOS PCT: 1.5 % (ref 0.0–5.0)
Eosinophils Absolute: 0.1 10*3/uL (ref 0.0–0.7)
HCT: 42.6 % (ref 39.0–52.0)
HEMOGLOBIN: 14.7 g/dL (ref 13.0–17.0)
LYMPHS ABS: 3.2 10*3/uL (ref 0.7–4.0)
Lymphocytes Relative: 36.6 % (ref 12.0–46.0)
MCHC: 34.6 g/dL (ref 30.0–36.0)
MCV: 92.6 fl (ref 78.0–100.0)
MONO ABS: 0.7 10*3/uL (ref 0.1–1.0)
MONOS PCT: 8.4 % (ref 3.0–12.0)
Neutro Abs: 4.6 10*3/uL (ref 1.4–7.7)
Neutrophils Relative %: 52.7 % (ref 43.0–77.0)
Platelets: 267 10*3/uL (ref 150.0–400.0)
RBC: 4.6 Mil/uL (ref 4.22–5.81)
RDW: 12.7 % (ref 11.5–15.5)
WBC: 8.8 10*3/uL (ref 4.0–10.5)

## 2016-11-18 LAB — COMPREHENSIVE METABOLIC PANEL
ALT: 41 U/L (ref 0–53)
AST: 23 U/L (ref 0–37)
Albumin: 4.6 g/dL (ref 3.5–5.2)
Alkaline Phosphatase: 75 U/L (ref 39–117)
BILIRUBIN TOTAL: 0.5 mg/dL (ref 0.2–1.2)
BUN: 16 mg/dL (ref 6–23)
CO2: 27 meq/L (ref 19–32)
CREATININE: 0.98 mg/dL (ref 0.40–1.50)
Calcium: 9.5 mg/dL (ref 8.4–10.5)
Chloride: 102 mEq/L (ref 96–112)
GFR: 83.02 mL/min (ref >60.00)
GLUCOSE: 93 mg/dL (ref 70–99)
Potassium: 4.2 mEq/L (ref 3.5–5.1)
Sodium: 137 mEq/L (ref 135–145)
Total Protein: 7 g/dL (ref 6.0–8.3)

## 2016-11-18 LAB — LIPASE: Lipase: 36 U/L (ref 11.0–59.0)

## 2016-11-19 ENCOUNTER — Ambulatory Visit (HOSPITAL_BASED_OUTPATIENT_CLINIC_OR_DEPARTMENT_OTHER)
Admission: RE | Admit: 2016-11-19 | Discharge: 2016-11-19 | Disposition: A | Discharge status: Home / Self Care | Payer: Managed Care, Other (non HMO) | Source: Ambulatory Visit | Attending: Adult Health | Admitting: Adult Health

## 2016-11-19 DIAGNOSIS — R1011 Right upper quadrant pain: Secondary | ICD-10-CM | POA: Diagnosis not present

## 2016-11-25 ENCOUNTER — Ambulatory Visit (INDEPENDENT_AMBULATORY_CARE_PROVIDER_SITE_OTHER): Payer: Managed Care, Other (non HMO) | Admitting: Internal Medicine

## 2016-11-25 ENCOUNTER — Encounter: Payer: Self-pay | Admitting: Internal Medicine

## 2016-11-25 VITALS — BP 124/60 | HR 74 | Temp 97.8°F | Wt 242.8 lb

## 2016-11-25 DIAGNOSIS — F339 Major depressive disorder, recurrent, unspecified: Secondary | ICD-10-CM | POA: Diagnosis not present

## 2016-11-25 DIAGNOSIS — F321 Major depressive disorder, single episode, moderate: Secondary | ICD-10-CM | POA: Diagnosis not present

## 2016-11-25 DIAGNOSIS — M1A00X0 Idiopathic chronic gout, unspecified site, without tophus (tophi): Secondary | ICD-10-CM | POA: Diagnosis not present

## 2016-11-25 MED ORDER — OMEPRAZOLE 20 MG PO CPDR: 20.0000 mg | DELAYED_RELEASE_CAPSULE | Freq: Every day | ORAL | 3 refills | Status: DC

## 2016-11-25 NOTE — Progress Notes (Signed)
Subjective:    Patient ID: [name] [name], male    DOB: 1956/05/30, 61 y.o.   MRN: [id number]  HPI 61 year old patient who is seen today in follow-up.  He was seen earlier in the month with epigastric pain.  Gaseousness that radiated to the back area.  He had a follow-up abdominal ultrasound as well as lab and EKG that was unremarkable.  He has had no recurrent symptoms He has been on chronic Prilosec He has done well since Lexapro has been added to his regimen. He has been quite pleased with the response.  Past Medical History:  Diagnosis Date  . Hx of gout   . Hx of sinusitis   . Inflammation of foot due to trauma      Social History   Social History  . Marital status: Married    Spouse name: N/A  . Number of children: N/A  . Years of education: N/A   Occupational History  . Not on file.   Social History Main Topics  . Smoking status: Former Research scientist (life sciences)  . Smokeless tobacco: Never Used  . Alcohol use 0.5 oz/week    1 drink(s) per week  . Drug use: No  . Sexual activity: Yes   Other Topics Concern  . Not on file   Social History Narrative  . No narrative on file    Past Surgical History:  Procedure Laterality Date  . back surgery  2005  . FOOT SURGERY  1992  . fusion c6-7     dr Larose Hires  . HERNIA REPAIR    . KNEE ARTHROSCOPY  2003  . lumber fusion     dr cabel  . NECK SURGERY  2005  . SPINE SURGERY      No family history on file.  Allergies  Allergen Reactions  . Hydrocodone-Acetaminophen     REACTION: unspecified  . Sulfamethoxazole     REACTION: unspecified    Current Outpatient Prescriptions on File Prior to Visit  Medication Sig Dispense Refill  . allopurinol (ZYLOPRIM) 300 MG tablet TAKE 1 TABLET (300 MG TOTAL) BY MOUTH DAILY. 90 tablet 2  . buPROPion (WELLBUTRIN XL) 300 MG 24 hr tablet TAKE 1 TABLET BY MOUTH EVERY DAY 90 tablet 1  . escitalopram (LEXAPRO) 10 MG tablet Take 10 mg by mouth daily. Patient unsure of dosage.    . predniSONE  (DELTASONE) 20 MG tablet Take 1 tablet (20 mg total) by mouth 2 (two) times daily with a meal. 12 tablet 0   No current facility-administered medications on file prior to visit.     BP 124/60 (BP Location: Left Arm, Patient Position: Sitting, Cuff Size: Normal)   Pulse 74   Temp 97.8 F (36.6 C) (Oral)   Wt 242 lb 12.8 oz (110.1 kg)   SpO2 98%   BMI 34.15 kg/m      Review of Systems  Constitutional: Negative for appetite change, chills, fatigue and fever.  HENT: Negative for congestion, dental problem, ear pain, hearing loss, sore throat, tinnitus, trouble swallowing and voice change.   Eyes: Negative for pain, discharge and visual disturbance.  Respiratory: Negative for cough, chest tightness, wheezing and stridor.   Cardiovascular: Negative for chest pain, palpitations and leg swelling.  Gastrointestinal: Positive for abdominal pain. Negative for abdominal distention, blood in stool, constipation, diarrhea, nausea and vomiting.  Genitourinary: Negative for difficulty urinating, discharge, flank pain, genital sores, hematuria and urgency.  Musculoskeletal: Negative for arthralgias, back pain, gait problem, joint swelling, myalgias and neck stiffness.  Skin: Negative for rash.  Neurological: Negative for dizziness, syncope, speech difficulty, weakness, numbness and headaches.  Hematological: Negative for adenopathy. Does not bruise/bleed easily.  Psychiatric/Behavioral: Positive for dysphoric mood. Negative for behavioral problems. The patient is nervous/anxious.        Objective:   Physical Exam  Constitutional: He is oriented to person, place, and time. He appears well-developed.  HENT:  Head: Normocephalic.  Right Ear: External ear normal.  Left Ear: External ear normal.  Eyes: Conjunctivae and EOM are normal.  Neck: Normal range of motion.  Cardiovascular: Normal rate and normal heart sounds.   Pulmonary/Chest: Breath sounds normal. No respiratory distress. He has no  wheezes.  Abdominal: Bowel sounds are normal. He exhibits no distension. There is no tenderness. There is no rebound.  Musculoskeletal: Normal range of motion. He exhibits no edema or tenderness.  Neurological: He is alert and oriented to person, place, and time.  Psychiatric: He has a normal mood and affect. His behavior is normal.          Assessment & Plan:   Abdominal/epigastric pain.  Probable flare of reflux.  Patient states she does have a hilar hernia.  He has had no recurrent symptoms History depression.  This has done remarkably well on Lexapro.  The patient wishes to taper and discontinue Wellbutrin if possible  [name]

## 2016-11-25 NOTE — Patient Instructions (Addendum)
Take Wellbutrin every other day until present supply completed, then discontinue Continue Lexapro once daily  Avoids foods high in acid such as tomatoes citrus juices, and spicy foods.  Avoid eating within two hours of lying down or before exercising.  Do not overheat.  Try smaller more frequent meals.  If symptoms persist, elevate the head of her bed 12 inches while sleeping.   Food Choices for Gastroesophageal Reflux Disease, Adult When you have gastroesophageal reflux disease (GERD), the foods you eat and your eating habits are very important. Choosing the right foods can help ease your discomfort. What guidelines do I need to follow?  Choose fruits, vegetables, whole grains, and low-fat dairy products.  Choose low-fat meat, fish, and poultry.  Limit fats such as oils, salad dressings, butter, nuts, and avocado.  Keep a food diary. This helps you identify foods that cause symptoms.  Avoid foods that cause symptoms. These may be different for everyone.  Eat small meals often instead of 3 large meals a day.  Eat your meals slowly, in a place where you are relaxed.  Limit fried foods.  Cook foods using methods other than frying.  Avoid drinking alcohol.  Avoid drinking large amounts of liquids with your meals.  Avoid bending over or lying down until 2-3 hours after eating. What foods are not recommended? These are some foods and drinks that may make your symptoms worse: Vegetables  Tomatoes. Tomato juice. Tomato and spaghetti sauce. Chili peppers. Onion and garlic. Horseradish. Fruits  Oranges, grapefruit, and lemon (fruit and juice). Meats  High-fat meats, fish, and poultry. This includes hot dogs, ribs, ham, sausage, salami, and bacon. Dairy  Whole milk and chocolate milk. Sour cream. Cream. Butter. Ice cream. Cream cheese. Drinks  Coffee and tea. Bubbly (carbonated) drinks or energy drinks. Condiments  Hot sauce. Barbecue sauce. Sweets/Desserts  Chocolate and cocoa.  Donuts. Peppermint and spearmint. Fats and Oils  High-fat foods. This includes [place] fries and potato chips. Other  Vinegar. Strong spices. This includes black pepper, white pepper, red pepper, cayenne, curry powder, cloves, ginger, and chili powder. The items listed above may not be a complete list of foods and drinks to avoid. Contact your dietitian for more information.  This information is not intended to replace advice given to you by your health care provider. Make sure you discuss any questions you have with your health care provider. Document Released: 01/26/2012 Document Revised: 01/02/2016 Document Reviewed: 05/31/2013 Elsevier Interactive Patient Education  2017 [employer].

## 2016-12-01 ENCOUNTER — Telehealth: Payer: Self-pay | Admitting: Internal Medicine

## 2016-12-01 NOTE — Telephone Encounter (Signed)
[name] [name] pt would like to apologize for not calling you back in a timely manner and wanted to let you know that Dr. Raliegh Ip has given him his [place] results from back last week some time.

## 2016-12-02 NOTE — Telephone Encounter (Signed)
noted 

## 2017-02-11 ENCOUNTER — Other Ambulatory Visit: Payer: Self-pay | Admitting: Internal Medicine

## 2017-02-11 MED ORDER — ESCITALOPRAM OXALATE 10 MG PO TABS: 10.0000 mg | ORAL_TABLET | Freq: Every day | ORAL | 1 refills | Status: DC

## 2017-02-11 NOTE — Telephone Encounter (Signed)
Rx sent 

## 2017-02-11 NOTE — Telephone Encounter (Signed)
Needs a refill for escitalopram (LEXAPRO) 10 MG tablet  Sent to: [facility]  [phone] [place], [place]

## 2017-04-28 ENCOUNTER — Telehealth: Payer: Self-pay | Admitting: Internal Medicine

## 2017-04-28 NOTE — Telephone Encounter (Signed)
° ° ° °  Pt call to say he has been taking the below med and he had to figure out what works best for him. He found out that taking 2 works best for him. He has been taking 2 and now he is low and he has call to ask for a refill   escitalopram (LEXAPRO) 10 MG tablet   Pt will need a script saying take 2 pills a day    Pharmacy ;  [place]

## 2017-04-29 NOTE — Telephone Encounter (Signed)
Patient would like to start taking two tablets by mouth daily of Lexapro 10 mg. Would you like for me to send in a new rx into patient pharmacy? Please advise.

## 2017-04-30 ENCOUNTER — Other Ambulatory Visit: Payer: Self-pay | Admitting: Emergency Medicine

## 2017-04-30 MED ORDER — ESCITALOPRAM OXALATE 20 MG PO TABS: 20.0000 mg | ORAL_TABLET | Freq: Every day | ORAL | 1 refills | Status: DC

## 2017-04-30 NOTE — Telephone Encounter (Signed)
Okay for new prescription for Lexapro 20 mg

## 2017-04-30 NOTE — Telephone Encounter (Signed)
New rx has been sent to pharmacy for Lexapro 20 mg tablet. Nothing further needed

## 2017-05-19 ENCOUNTER — Other Ambulatory Visit: Payer: Self-pay | Admitting: Internal Medicine

## 2017-05-20 ENCOUNTER — Other Ambulatory Visit: Payer: Self-pay | Admitting: Internal Medicine

## 2017-05-20 MED ORDER — BUPROPION HCL ER (XL) 300 MG PO TB24: 300.0000 mg | ORAL_TABLET | Freq: Every day | ORAL | 2 refills | Status: DC

## 2017-06-20 ENCOUNTER — Other Ambulatory Visit: Payer: Self-pay | Admitting: Internal Medicine

## 2017-08-12 ENCOUNTER — Other Ambulatory Visit: Payer: Self-pay | Admitting: Internal Medicine

## 2017-08-12 MED ORDER — BUPROPION HCL ER (XL) 300 MG PO TB24: 300.0000 mg | ORAL_TABLET | Freq: Every day | ORAL | 3 refills | Status: DC

## 2017-09-24 ENCOUNTER — Telehealth: Payer: Self-pay | Admitting: Internal Medicine

## 2017-09-24 NOTE — Telephone Encounter (Signed)
Copied from [place] [phone]. Topic: Quick Communication - See Telephone Encounter >> Sep 24, 2017 10:41 AM Aurelio Brash B wrote: CRM for notification. See Telephone encounter for:  Wife  is requesting a phone call to talk about the pt, she thinks he has sleep apena  because he stops breathing during the night Please call her on her cell phone.    09/24/17.

## 2017-09-29 NOTE — Telephone Encounter (Signed)
Call patient to return phone call.

## 2017-09-30 NOTE — Telephone Encounter (Signed)
Please schedule return office visit and will refer for evaluation of OSA

## 2017-10-01 NOTE — Telephone Encounter (Signed)
Spoke to patient wife and she wanted to inform Dr.Kwiatkowski that she works for a company that studies sleep. She had her husband evaluated at her job and she will fax over the results. I informed her that a referral will be made for OT after the patient has been seen in the office. I scheduled an apt for a ROV.

## 2017-10-05 ENCOUNTER — Encounter: Payer: Self-pay | Admitting: Internal Medicine

## 2017-10-05 ENCOUNTER — Ambulatory Visit (INDEPENDENT_AMBULATORY_CARE_PROVIDER_SITE_OTHER): Payer: Managed Care, Other (non HMO) | Admitting: Internal Medicine

## 2017-10-05 VITALS — BP 102/80 | HR 77 | Temp 98.1°F | Wt 242.0 lb

## 2017-10-05 DIAGNOSIS — G4710 Hypersomnia, unspecified: Secondary | ICD-10-CM

## 2017-10-05 DIAGNOSIS — G4730 Sleep apnea, unspecified: Secondary | ICD-10-CM | POA: Diagnosis not present

## 2017-10-05 NOTE — Progress Notes (Signed)
Subjective:    Patient ID: [name] [name], male    DOB: 02-05-1956, 62 y.o.   MRN: [id number]  HPI 62 year old patient who is seen today to discuss OSA.  He has had a recent home sleep study performed by his dentist which revealed mild to moderate OSA. He does describe himself as a loud snorer and he and his wife do sleep in separate rooms.  She describes a disruptive sleep. The patient occasionally awakes tired and does have some occasional daytime sleepiness.  Present weight is 242  Past Medical History:  Diagnosis Date  . Hx of gout   . Hx of sinusitis   . Inflammation of foot due to trauma      Social History   Socioeconomic History  . Marital status: Married    Spouse name: Not on file  . Number of children: Not on file  . Years of education: Not on file  . Highest education level: Not on file  Social Needs  . Financial resource strain: Not on file  . Food insecurity - worry: Not on file  . Food insecurity - inability: Not on file  . Transportation needs - medical: Not on file  . Transportation needs - non-medical: Not on file  Occupational History  . Not on file  Tobacco Use  . Smoking status: Former Research scientist (life sciences)  . Smokeless tobacco: Never Used  Substance and Sexual Activity  . Alcohol use: Yes    Alcohol/week: 0.5 oz    Types: 1 drink(s) per week  . Drug use: No  . Sexual activity: Yes  Other Topics Concern  . Not on file  Social History Narrative  . Not on file    Past Surgical History:  Procedure Laterality Date  . back surgery  2005  . FOOT SURGERY  1992  . fusion c6-7     dr Larose Hires  . HERNIA REPAIR    . KNEE ARTHROSCOPY  2003  . lumber fusion     dr cabel  . NECK SURGERY  2005  . SPINE SURGERY      History reviewed. No pertinent family history.  Allergies  Allergen Reactions  . Hydrocodone-Acetaminophen     REACTION: unspecified  . Sulfamethoxazole     REACTION: unspecified    Current Outpatient Medications on File Prior to Visit    Medication Sig Dispense Refill  . allopurinol (ZYLOPRIM) 300 MG tablet TAKE 1 TABLET (300 MG TOTAL) BY MOUTH DAILY. 90 tablet 2  . allopurinol (ZYLOPRIM) 300 MG tablet TAKE 1 TABLET BY MOUTH EVERY DAY 90 tablet 3  . buPROPion (WELLBUTRIN XL) 300 MG 24 hr tablet Take 1 tablet (300 mg total) by mouth daily. 90 tablet 3  . escitalopram (LEXAPRO) 20 MG tablet Take 1 tablet (20 mg total) by mouth daily. 90 tablet 1  . omeprazole (PRILOSEC) 20 MG capsule Take 1 capsule (20 mg total) by mouth daily. 30 capsule 3   No current facility-administered medications on file prior to visit.     BP 102/80 (BP Location: Right Arm, Patient Position: Sitting, Cuff Size: Large)   Pulse 77   Temp 98.1 F (36.7 C) (Oral)   Wt 242 lb (109.8 kg)   SpO2 98%   BMI 34.04 kg/m      Review of Systems  Constitutional: Negative for appetite change, chills, fatigue and fever.  HENT: Negative for congestion, dental problem, ear pain, hearing loss, sore throat, tinnitus, trouble swallowing and voice change.   Eyes: Negative for pain, discharge  and visual disturbance.  Respiratory: Negative for cough, chest tightness, wheezing and stridor.   Cardiovascular: Negative for chest pain, palpitations and leg swelling.  Gastrointestinal: Negative for abdominal distention, abdominal pain, blood in stool, constipation, diarrhea, nausea and vomiting.  Genitourinary: Negative for difficulty urinating, discharge, flank pain, genital sores, hematuria and urgency.  Musculoskeletal: Negative for arthralgias, back pain, gait problem, joint swelling, myalgias and neck stiffness.  Skin: Negative for rash.  Neurological: Negative for dizziness, syncope, speech difficulty, weakness, numbness and headaches.  Hematological: Negative for adenopathy. Does not bruise/bleed easily.  Psychiatric/Behavioral: Positive for sleep disturbance. Negative for behavioral problems and dysphoric mood. The patient is not nervous/anxious.         Objective:   Physical Exam  Constitutional: He is oriented to person, place, and time. He appears well-developed.  HENT:  Head: Normocephalic.  Right Ear: External ear normal.  Left Ear: External ear normal.  Oropharynx unremarkable without crowding  Eyes: Conjunctivae and EOM are normal.  Neck: Normal range of motion.  Cardiovascular: Normal rate and normal heart sounds.  Pulmonary/Chest: Breath sounds normal.  Abdominal: Bowel sounds are normal.  Musculoskeletal: Normal range of motion. He exhibits no edema or tenderness.  Neurological: He is alert and oriented to person, place, and time.  Psychiatric: He has a normal mood and affect. His behavior is normal.          Assessment & Plan:   Mild OSA.  Options discussed including referral to pulmonary medicine for further documentation and consideration of CPAP.  Patient will consider but feels that he is not interested in CPAP at this time;  he was told that weight loss and an  oral appliance would be a reasonable option at this time.  Patient information concerning OSA dispensed Obesity weight loss encouraged  CPX 6 months  Margie Brink Pilar Plate

## 2017-10-05 NOTE — Patient Instructions (Addendum)
Limit your sodium (Salt) intake  You need to lose weight.  Consider a lower calorie diet and regular exercise.    It is important that you exercise regularly, at least 20 minutes 3 to 4 times per week.  If you develop chest pain or shortness of breath seek  medical attention. Sleep Apnea Sleep apnea is a condition in which breathing pauses or becomes shallow during sleep. Episodes of sleep apnea usually last 10 seconds or longer, and they may occur as many as 20 times an hour. Sleep apnea disrupts your sleep and keeps your body from getting the rest that it needs. This condition can increase your risk of certain health problems, including:  Heart attack.  Stroke.  Obesity.  Diabetes.  Heart failure.  Irregular heartbeat.  There are three kinds of sleep apnea:  Obstructive sleep apnea. This kind is caused by a blocked or collapsed airway.  Central sleep apnea. This kind happens when the part of the brain that controls breathing does not send the correct signals to the muscles that control breathing.  Mixed sleep apnea. This is a combination of obstructive and central sleep apnea.  What are the causes? The most common cause of this condition is a collapsed or blocked airway. An airway can collapse or become blocked if:  Your throat muscles are abnormally relaxed.  Your tongue and tonsils are larger than normal.  You are overweight.  Your airway is smaller than normal.  What increases the risk? This condition is more likely to develop in people who:  Are overweight.  Smoke.  Have a smaller than normal airway.  Are elderly.  Are male.  Drink alcohol.  Take sedatives or tranquilizers.  Have a family history of sleep apnea.  What are the signs or symptoms? Symptoms of this condition include:  Trouble staying asleep.  Daytime sleepiness and tiredness.  Irritability.  Loud snoring.  Morning headaches.  Trouble  concentrating.  Forgetfulness.  Decreased interest in sex.  Unexplained sleepiness.  Mood swings.  Personality changes.  Feelings of depression.  Waking up often during the night to urinate.  Dry mouth.  Sore throat.  How is this diagnosed? This condition may be diagnosed with:  A medical history.  A physical exam.  A series of tests that are done while you are sleeping (sleep study). These tests are usually done in a sleep lab, but they may also be done at home.  How is this treated? Treatment for this condition aims to restore normal breathing and to ease symptoms during sleep. It may involve managing health issues that can affect breathing, such as high blood pressure or obesity. Treatment may include:  Sleeping on your side.  Using a decongestant if you have nasal congestion.  Avoiding the use of depressants, including alcohol, sedatives, and narcotics.  Losing weight if you are overweight.  Making changes to your diet.  Quitting smoking.  Using a device to open your airway while you sleep, such as: ? An oral appliance. This is a custom-made mouthpiece that shifts your lower jaw forward. ? A continuous positive airway pressure (CPAP) device. This device delivers oxygen to your airway through a mask. ? A nasal expiratory positive airway pressure (EPAP) device. This device has valves that you put into each nostril. ? A bi-level positive airway pressure (BPAP) device. This device delivers oxygen to your airway through a mask.  Surgery if other treatments do not work. During surgery, excess tissue is removed to create a wider airway.  It is important to get treatment for sleep apnea. Without treatment, this condition can lead to:  High blood pressure.  Coronary artery disease.  (Men) An inability to achieve or maintain an erection (impotence).  Reduced thinking abilities.  Follow these instructions at home:  Make any lifestyle changes that your health  care provider recommends.  Eat a healthy, well-balanced diet.  Take over-the-counter and prescription medicines only as told by your health care provider.  Avoid using depressants, including alcohol, sedatives, and narcotics.  Take steps to lose weight if you are overweight.  If you were given a device to open your airway while you sleep, use it only as told by your health care provider.  Do not use any tobacco products, such as cigarettes, chewing tobacco, and e-cigarettes. If you need help quitting, ask your health care provider.  Keep all follow-up visits as told by your health care provider. This is important. Contact a health care provider if:  The device that you received to open your airway during sleep is uncomfortable or does not seem to be working.  Your symptoms do not improve.  Your symptoms get worse. Get help right away if:  You develop chest pain.  You develop shortness of breath.  You develop discomfort in your back, arms, or stomach.  You have trouble speaking.  You have weakness on one side of your body.  You have drooping in your face. These symptoms may represent a serious problem that is an emergency. Do not wait to see if the symptoms will go away. Get medical help right away. Call your local emergency services (911 in the [place] Do not drive yourself to the hospital. This information is not intended to replace advice given to you by your health care provider. Make sure you discuss any questions you have with your health care provider. Document Released: 07/17/2002 Document Revised: 03/22/2016 Document Reviewed: 05/06/2015 Elsevier Interactive Patient Education  [employer].

## 2017-10-24 ENCOUNTER — Other Ambulatory Visit: Payer: Self-pay | Admitting: Internal Medicine

## 2017-11-09 ENCOUNTER — Other Ambulatory Visit: Payer: Self-pay | Admitting: Internal Medicine

## 2018-05-07 ENCOUNTER — Other Ambulatory Visit: Payer: Self-pay | Admitting: Internal Medicine

## 2018-06-27 ENCOUNTER — Encounter: Payer: Self-pay | Admitting: Family Medicine

## 2018-06-27 ENCOUNTER — Ambulatory Visit: Payer: Self-pay

## 2018-06-27 ENCOUNTER — Ambulatory Visit (INDEPENDENT_AMBULATORY_CARE_PROVIDER_SITE_OTHER): Payer: Managed Care, Other (non HMO) | Admitting: Family Medicine

## 2018-06-27 ENCOUNTER — Ambulatory Visit (INDEPENDENT_AMBULATORY_CARE_PROVIDER_SITE_OTHER): Payer: Managed Care, Other (non HMO)

## 2018-06-27 ENCOUNTER — Telehealth: Payer: Self-pay | Admitting: Internal Medicine

## 2018-06-27 VITALS — BP 120/70 | HR 71 | Temp 98.3°F | Resp 16 | Ht 70.7 in | Wt 248.6 lb

## 2018-06-27 DIAGNOSIS — M25511 Pain in right shoulder: Secondary | ICD-10-CM

## 2018-06-27 DIAGNOSIS — M549 Dorsalgia, unspecified: Secondary | ICD-10-CM | POA: Diagnosis not present

## 2018-06-27 DIAGNOSIS — R0602 Shortness of breath: Secondary | ICD-10-CM | POA: Diagnosis not present

## 2018-06-27 MED ORDER — METHOCARBAMOL 500 MG PO TABS: 500.0000 mg | ORAL_TABLET | Freq: Three times a day (TID) | ORAL | 0 refills | Status: AC | PRN

## 2018-06-27 NOTE — Telephone Encounter (Signed)
Pt called [facility] [place] office expressing concerns about appointment with [facility].  He stated he has appointment 11/21 but he is having shortness of breath and pain under rib cage.  I transferred back to [facility] spoke with Angie she was going to talk to pt and have him triaged

## 2018-06-27 NOTE — Progress Notes (Signed)
ACUTE VISIT   HPI:  Chief Complaint  Patient presents with  . Shoulder Pain    Pt present for right "shoulder and shoulder blade" pain. Sx started 2 weeks ago.   . Fatigue  . Shortness of Breath    Sxx started 3 weeks ago. SOB When active.     [name] is a 62 y.o. male, who is here today complaining of 2 weeks of right shoulder pain. He denies prior history of shoulder pain, reports this problem as new. No history of trauma but he thinks he may have pulled a muscle while working. Pain was initially sharp intermittent and now is constant dull pain, 6-8/10. It is exacerbated by movement and by lying in bed on right side.  Right upper back pain exacerbated by neck range of motion. Pain is radiated to right trapezium and supraclavicular area. He denies numbness or tingling. He states that he has some right upper extremity weakness, he cannot grab things as he used to. He has not noted local erythema, rash, or edema. Reviewing records he has history of thoracic back and cervical pain. He has not taking OTC analgesics. Pain seems to be stable overall.  He is also complaining about shortness of breath for 3-4 weeks, gets "tired" earlier. Pain is exacerbated by certain activities like going upstairs. He denies associated chest pain, diaphoresis, or palpitations.    Review of Systems  Constitutional: Positive for fatigue. Negative for activity change, appetite change and fever.  HENT: Negative for mouth sores, nosebleeds, sore throat and trouble swallowing.   Eyes: Negative for redness and visual disturbance.  Respiratory: Positive for shortness of breath. Negative for cough and wheezing.   Cardiovascular: Negative for chest pain, palpitations and leg swelling.  Gastrointestinal: Negative for abdominal pain, nausea and vomiting.  Genitourinary: Negative for decreased urine volume, dysuria and hematuria.  Musculoskeletal: Positive for arthralgias. Negative for gait  problem and joint swelling.  Neurological: Positive for weakness (Mild weakness RUE for months.). Negative for numbness and headaches.  Psychiatric/Behavioral: Negative for confusion. The patient is nervous/anxious.       Current Outpatient Medications on File Prior to Visit  Medication Sig Dispense Refill  . allopurinol (ZYLOPRIM) 300 MG tablet TAKE 1 TABLET (300 MG TOTAL) BY MOUTH DAILY. 90 tablet 2  . allopurinol (ZYLOPRIM) 300 MG tablet TAKE 1 TABLET BY MOUTH EVERY DAY 90 tablet 3  . buPROPion (WELLBUTRIN XL) 300 MG 24 hr tablet Take 1 tablet (300 mg total) by mouth daily. 90 tablet 3  . escitalopram (LEXAPRO) 20 MG tablet TAKE 1 TABLET BY MOUTH EVERY DAY 90 tablet 1  . omeprazole (PRILOSEC) 20 MG capsule Take 1 capsule (20 mg total) by mouth daily. 30 capsule 3   No current facility-administered medications on file prior to visit.      Past Medical History:  Diagnosis Date  . Hx of gout   . Hx of sinusitis   . Inflammation of foot due to trauma    Allergies  Allergen Reactions  . Hydrocodone-Acetaminophen     REACTION: unspecified  . Sulfamethoxazole     REACTION: unspecified    Social History   Socioeconomic History  . Marital status: Married    Spouse name: Not on file  . Number of children: Not on file  . Years of education: Not on file  . Highest education level: Not on file  Occupational History  . Not on file  Social Needs  . Financial resource strain:  Not on file  . Food insecurity:    Worry: Not on file    Inability: Not on file  . Transportation needs:    Medical: Not on file    Non-medical: Not on file  Tobacco Use  . Smoking status: Former Research scientist (life sciences)  . Smokeless tobacco: Never Used  Substance and Sexual Activity  . Alcohol use: Yes    Alcohol/week: 1.0 standard drinks    Types: 1 drink(s) per week  . Drug use: No  . Sexual activity: Yes  Lifestyle  . Physical activity:    Days per week: Not on file    Minutes per session: Not on file  .  Stress: Not on file  Relationships  . Social connections:    Talks on phone: Not on file    Gets together: Not on file    Attends religious service: Not on file    Active member of club or organization: Not on file    Attends meetings of clubs or organizations: Not on file    Relationship status: Not on file  Other Topics Concern  . Not on file  Social History Narrative  . Not on file    Vitals:   06/27/18 1510  BP: 120/70  Pulse: 71  Resp: 16  Temp: 98.3 F (36.8 C)  SpO2: 96%   Body mass index is 34.97 kg/m.    Physical Exam  Nursing note and vitals reviewed. Constitutional: He is oriented to person, place, and time. He appears well-developed. No distress.  HENT:  Head: Normocephalic and atraumatic.  Mouth/Throat: Oropharynx is clear and moist and mucous membranes are normal.  Eyes: Conjunctivae are normal.  Cardiovascular: Normal rate and regular rhythm.  No murmur heard. Respiratory: Effort normal and breath sounds normal. No respiratory distress.  GI: Soft. He exhibits no mass. There is no hepatomegaly. There is no tenderness.  Musculoskeletal: He exhibits no edema.       Right shoulder: He exhibits decreased range of motion (mild) and tenderness (upon palpation of anterior aspect.).       Thoracic back: He exhibits tenderness. He exhibits no bony tenderness.       Back:  Shoulder: No deformity, edema, or erythema appreciated.No muscle atrophy. Luan Pulling' test negative, empty can supraspinatus test  Elicits mild tenderness, lift-Off Subscapularis test negative. ROM mildly limited, passive > active.  Lymphadenopathy:    He has no cervical adenopathy.  Neurological: He is alert and oriented to person, place, and time. He has normal strength. No cranial nerve deficit. Gait normal.  Skin: Skin is warm. No rash noted. No erythema.  Psychiatric: He has a normal mood and affect.  Fairly groomed, good eye contact.      ASSESSMENT AND PLAN:   [name] was  seen today for shoulder pain, fatigue and shortness of breath.  Orders Placed This Encounter  Procedures  . DG Chest 2 View  . DG Shoulder Right  . Basic metabolic panel  . CBC  . EKG 12-Lead   : Lab Results  Component Value Date   WBC 8.3 06/27/2018   HGB 14.5 06/27/2018   HCT 42.6 06/27/2018   MCV 95.0 06/27/2018   PLT 266.0 06/27/2018   Lab Results  Component Value Date   CREATININE 0.94 06/27/2018   BUN 14 06/27/2018   NA 138 06/27/2018   K 4.3 06/27/2018   CL 103 06/27/2018   CO2 26 06/27/2018     Acute pain of right shoulder Possible causes discussed: OA,rotator cuff  tendinitis. He prefers to hold on PT. ROM exercises recommended. F/U as needed.  -     DG Shoulder Right; Future  Shortness of breath ? Fatigue,decontioning,COPD,and cardiac etiology among some to consider. EKG done today SR, normal axis,normal intervals,no signs os ischemia. Compared with EKG 11/2016 PAC's not present now,rest no significant changes. Instructed about warning signs. Further recommendations will be given according to CXR and lab results.  -     EKG 12-Lead -     Basic metabolic panel -     CBC -     DG Chest 2 View; Future  Upper back pain on right side Seems to be chronic. Muscle relaxants side effects discussed. Topical Icy hot or Asper cream may also help.  -     methocarbamol (ROBAXIN) 500 MG tablet; Take 1 tablet (500 mg total) by mouth every 8 (eight) hours as needed for up to 15 days for muscle spasms.      Return if symptoms worsen or fail to improve.       Riyad Keena G. [name], MD  [facility]. [place] office.

## 2018-06-27 NOTE — Telephone Encounter (Signed)
Noted  

## 2018-06-27 NOTE — Telephone Encounter (Signed)
Patient called in with c/o "shoulder pain." He says "I have pain under my right shoulder blade that's been going on for 1 week at least, maybe longer. I don't know if I injured my arm or if it's something else going on. I would like to be seen sooner than Thursday to get this looked at." I asked about the pain level, he says "it from a 6-8." I asked about injury, he says "well, I work using my arms and I don't feel like I injured my shoulder."  I asked about other symptoms, he says "my neck hurts when I turn it to. I've been SOB, tired feeling, light headed at times. I don't know if it's my gallbladder or what it could be." I asked about sweating, chest pain, he denies both. According to protocol, see PCP within 3 days. Patient has a transfer of care with Dr. Volanda Napoleon on Thursday, but patient is requesting to be seen today by any provider available. Appointment scheduled for today at 1500 with Dr. [name], care advice given, patient verbalized understanding. I asked did he want to reschedule to transfer of care appointment, he says "let me see how things go today and I will make a decision. I don't want to be charged $50 for cancelling." I advised if he decides he's not coming after his appointment to go ahead and cancel while he's in the office today and reschedule, if he chooses to. He verbalized understanding.  Reason for Disposition . [1] MODERATE pain (e.g., interferes with normal activities) AND [2] present > 3 days  Answer Assessment - Initial Assessment Questions 1. ONSET: "When did the pain start?"     1 week ago 2. LOCATION: "Where is the pain located?"     Right shoulder 3. PAIN: "How bad is the pain?" (Scale 1-10; or mild, moderate, severe)   - MILD (1-3): doesn't interfere with normal activities   - MODERATE (4-7): interferes with normal activities (e.g., work or school) or awakens from sleep   - SEVERE (8-10): excruciating pain, unable to do any normal activities, unable to move arm at all due  to pain     6-8 4. WORK OR EXERCISE: "Has there been any recent work or exercise that involved this part of the body?"     I work using my arms 5. CAUSE: "What do you think is causing the shoulder pain?"     I don't know 6. OTHER SYMPTOMS: "Do you have any other symptoms?" (e.g., neck pain, swelling, rash, fever, numbness, weakness)     Slight neck pain when I turn my head, SOB, indigestion, light headed, tired 7. PREGNANCY: "Is there any chance you are pregnant?" "When was your last menstrual period?"     N/A  Protocols used: SHOULDER PAIN-A-AH

## 2018-06-27 NOTE — Patient Instructions (Addendum)
  [name] [name] Ent I have seen you today for an acute visit.  A few things to remember from today's visit:   Shortness of breath - Plan: EKG 99-UFCZ, Basic metabolic panel, CBC, DG Chest 2 View  Acute pain of right shoulder - Plan: DG Shoulder Right  Upper back pain on right side - Plan: methocarbamol (ROBAXIN) 500 MG tablet   Range of motion exercises. Muscle relaxant can cause drowsiness, so you might want to take it at bedtime. Topical icy hot or Aspercreme. Let me know if you decide to start physical therapy.     In general please monitor for signs of worsening symptoms and seek immediate medical attention if any concerning.  If symptoms are not resolved in 3-4 weeks you should schedule a follow up appointment with your doctor, before if needed.  I hope you get better soon!

## 2018-06-28 LAB — BASIC METABOLIC PANEL
BUN: 14 mg/dL (ref 6–23)
CO2: 26 meq/L (ref 19–32)
CREATININE: 0.94 mg/dL (ref 0.40–1.50)
Calcium: 9.5 mg/dL (ref 8.4–10.5)
Chloride: 103 mEq/L (ref 96–112)
GFR: 86.64 mL/min (ref >60.00)
GLUCOSE: 98 mg/dL (ref 70–99)
Potassium: 4.3 mEq/L (ref 3.5–5.1)
Sodium: 138 mEq/L (ref 135–145)

## 2018-06-28 LAB — CBC
HCT: 42.6 % (ref 39.0–52.0)
Hemoglobin: 14.5 g/dL (ref 13.0–17.0)
MCHC: 34 g/dL (ref 30.0–36.0)
MCV: 95 fl (ref 78.0–100.0)
Platelets: 266 10*3/uL (ref 150.0–400.0)
RBC: 4.48 Mil/uL (ref 4.22–5.81)
RDW: 13.1 % (ref 11.5–15.5)
WBC: 8.3 10*3/uL (ref 4.0–10.5)

## 2018-06-30 ENCOUNTER — Encounter: Payer: Managed Care, Other (non HMO) | Admitting: Family Medicine

## 2018-07-13 ENCOUNTER — Other Ambulatory Visit: Payer: Self-pay | Admitting: Internal Medicine

## 2018-08-28 ENCOUNTER — Other Ambulatory Visit: Payer: Self-pay | Admitting: Internal Medicine

## 2018-09-02 ENCOUNTER — Encounter: Payer: Self-pay | Admitting: Gastroenterology

## 2018-09-02 ENCOUNTER — Encounter: Payer: Self-pay | Admitting: Family Medicine

## 2018-09-02 ENCOUNTER — Ambulatory Visit (INDEPENDENT_AMBULATORY_CARE_PROVIDER_SITE_OTHER): Payer: Managed Care, Other (non HMO) | Admitting: Family Medicine

## 2018-09-02 VITALS — BP 120/78 | HR 78 | Temp 98.0°F | Resp 12 | Ht 70.7 in | Wt 252.0 lb

## 2018-09-02 DIAGNOSIS — M546 Pain in thoracic spine: Secondary | ICD-10-CM

## 2018-09-02 DIAGNOSIS — K219 Gastro-esophageal reflux disease without esophagitis: Secondary | ICD-10-CM

## 2018-09-02 DIAGNOSIS — R5382 Chronic fatigue, unspecified: Secondary | ICD-10-CM | POA: Diagnosis not present

## 2018-09-02 DIAGNOSIS — R0602 Shortness of breath: Secondary | ICD-10-CM | POA: Insufficient documentation

## 2018-09-02 DIAGNOSIS — G4730 Sleep apnea, unspecified: Secondary | ICD-10-CM

## 2018-09-02 DIAGNOSIS — G4710 Hypersomnia, unspecified: Secondary | ICD-10-CM | POA: Diagnosis not present

## 2018-09-02 DIAGNOSIS — Z1211 Encounter for screening for malignant neoplasm of colon: Secondary | ICD-10-CM

## 2018-09-02 NOTE — Progress Notes (Signed)
ACUTE VISIT   HPI:  Chief Complaint  Patient presents with  . Follow-up    discuss concern about COPD, Fatigue and Colonoscopy questions    [name] is a 63 y.o. male, who is here today complaining of fatigue and SOB.  He has long Hx of SOB, it seems to be stable. He wonders if she has COPD.  Exertional dyspnea with heavy lifting and walking stairs.  No associated wheezing or cough. Hx of tobacco use.  Hx of GERD,symptoms well controlled with Omeprazole 20 mg daily prn. He has not noted heartburn,nausea,or abdominal pain.  He is also c/o mid back pain,severe sometimes. Upper pack pain R>L.  This is a chronic problem. Sharp, 7/10,no radiated.  Negative for associated numbness or tingling. Exacerbated by repetitive upper extremities movement. Alleviated by rest.   Also c/o fatigue. Hx of depression and OSA. He is on Lexapro 20 mg and Welbutrin XR 300 mg.  He did not want CPAP. He is wearing a mouth piece and he has not felt like it has helped with fatigue. He does not feel rested in the morning. Denies falling asleep while driving.  He is also due for colonoscopy. No changes in bowel habits or blood in stool.   Review of Systems  Constitutional: Positive for fatigue. Negative for activity change, appetite change, diaphoresis and fever.  HENT: Negative for nosebleeds, sore throat and trouble swallowing.   Eyes: Negative for redness and visual disturbance.  Respiratory: Positive for apnea and shortness of breath. Negative for cough and wheezing.   Cardiovascular: Negative for chest pain, palpitations and leg swelling.  Gastrointestinal: Negative for abdominal pain, nausea and vomiting.  Genitourinary: Negative for decreased urine volume and hematuria.  Musculoskeletal: Positive for back pain. Negative for gait problem.  Skin: Negative for color change and rash.  Neurological: Negative for syncope, weakness, numbness and headaches.    Psychiatric/Behavioral: Negative for confusion. The patient is nervous/anxious.       Current Outpatient Medications on File Prior to Visit  Medication Sig Dispense Refill  . ACCU-CHEK AVIVA PLUS test strip USE TO CHECK ONCE DAILY  2  . allopurinol (ZYLOPRIM) 300 MG tablet TAKE 1 TABLET BY MOUTH EVERY DAY 90 tablet 3  . buPROPion (WELLBUTRIN XL) 300 MG 24 hr tablet Take 1 tablet (300 mg total) by mouth daily. 90 tablet 3  . escitalopram (LEXAPRO) 20 MG tablet TAKE 1 TABLET BY MOUTH EVERY DAY 90 tablet 1  . omeprazole (PRILOSEC) 20 MG capsule Take 1 capsule (20 mg total) by mouth daily. 30 capsule 3   No current facility-administered medications on file prior to visit.      Past Medical History:  Diagnosis Date  . Hx of gout   . Hx of sinusitis   . Inflammation of foot due to trauma    Allergies  Allergen Reactions  . Hydrocodone-Acetaminophen     REACTION: unspecified  . Sulfamethoxazole     REACTION: unspecified    Social History   Socioeconomic History  . Marital status: Married    Spouse name: Not on file  . Number of children: Not on file  . Years of education: Not on file  . Highest education level: Not on file  Occupational History  . Not on file  Social Needs  . Financial resource strain: Not on file  . Food insecurity:    Worry: Not on file    Inability: Not on file  . Transportation needs:  Medical: Not on file    Non-medical: Not on file  Tobacco Use  . Smoking status: Former Research scientist (life sciences)  . Smokeless tobacco: Never Used  Substance and Sexual Activity  . Alcohol use: Yes    Alcohol/week: 1.0 standard drinks    Types: 1 drink(s) per week  . Drug use: No  . Sexual activity: Yes  Lifestyle  . Physical activity:    Days per week: Not on file    Minutes per session: Not on file  . Stress: Not on file  Relationships  . Social connections:    Talks on phone: Not on file    Gets together: Not on file    Attends religious service: Not on file     Active member of club or organization: Not on file    Attends meetings of clubs or organizations: Not on file    Relationship status: Not on file  Other Topics Concern  . Not on file  Social History Narrative  . Not on file    Vitals:   09/02/18 0929  BP: 120/78  Pulse: 78  Resp: 12  Temp: 98 F (36.7 C)  SpO2: 98%   Body mass index is 35.45 kg/m.   Physical Exam  Nursing note reviewed. Constitutional: He is oriented to person, place, and time. He appears well-developed. No distress.  HENT:  Head: Normocephalic and atraumatic.  Mouth/Throat: Oropharynx is clear and moist and mucous membranes are normal.  Eyes: Pupils are equal, round, and reactive to light. Conjunctivae are normal.  Cardiovascular: Normal rate and regular rhythm.  No murmur heard. Respiratory: Effort normal and breath sounds normal. No respiratory distress.  GI: Soft. He exhibits no mass. There is no hepatomegaly. There is no abdominal tenderness.  Musculoskeletal:        General: No edema.  Lymphadenopathy:    He has no cervical adenopathy.  Neurological: He is alert and oriented to person, place, and time. He has normal strength. No cranial nerve deficit. Gait normal.  Skin: Skin is warm. No rash noted. No erythema.  Psychiatric: His mood appears anxious.  Well groomed, good eye contact.     ASSESSMENT AND PLAN:   [name] was seen today for follow-up.  Diagnoses and all orders for this visit:  Thoracic spine pain This is a chronic pain. Recommend avoiding trigger factors. Monitor for numbness or tingling. Weight loss may help. If pain is persistent we could consider thoracic MRI and/or Ortho evaluation.  Shortness of breath This problem also seems to be chronic. We discussed possible etiologies:?  Deconditioning, COPD, cardiac, and GERD among some. He would like to be evaluated for COPD. She prefers to hold on cardiologist evaluation. He was clearly instructed about warning  signs. Referral to pulmonologist placed.  Hypersomnia with sleep apnea We discussed possible adverse effects of OSA. He would like to consider CPAP. Appointment with pulmonology will be arranged.  Chronic fatigue We discussed possible causes. Many of his chronic health problem could be contributing factors. He is going to see a pulmonologist to discuss CPAP. He a healthful diet and regular physical activity (as tolerated) may help.    Colon cancer screening -     Ambulatory referral to Gastroenterology  Gastroesophageal reflux disease, esophagitis presence not specified This problem could be contributing to some of his symptoms. GERD precautions reviewed and recommended. Recommend taking Omeprazole 20 mg daily.    Return in about 3 months (around 12/02/2018).     Pete Merten G. [name], MD  [facility]. [place] office.

## 2018-09-02 NOTE — Assessment & Plan Note (Signed)
We discussed possible adverse effects of OSA. He would like to consider CPAP. Appointment with pulmonology will be arranged.

## 2018-09-02 NOTE — Assessment & Plan Note (Signed)
This problem also seems to be chronic. We discussed possible etiologies:?  Deconditioning, COPD, cardiac, and GERD among some. He would like to be evaluated for COPD. She prefers to hold on cardiologist evaluation. He was clearly instructed about warning signs. Referral to pulmonologist placed.

## 2018-09-02 NOTE — Assessment & Plan Note (Addendum)
This is a chronic pain. Recommend avoiding trigger factors. Monitor for numbness or tingling. Weight loss may help. If pain is persistent we could consider thoracic MRI and/or Ortho evaluation.

## 2018-09-02 NOTE — Assessment & Plan Note (Signed)
We discussed possible causes. Many of his chronic health problem could be contributing factors. He is going to see a pulmonologist to discuss CPAP. He a healthful diet and regular physical activity (as tolerated) may help.

## 2018-09-02 NOTE — Patient Instructions (Addendum)
A few things to remember from today's visit:   Hypersomnia with sleep apnea - Plan: Ambulatory referral to Pulmonology  Thoracic spine pain  Chronic fatigue  Shortness of breath - Plan: Ambulatory referral to Pulmonology  Colon cancer screening - Plan: Ambulatory referral to Gastroenterology  Gastroesophageal reflux disease, esophagitis presence not specified  Start regular physical activity as tolerated. Control portions. Take Omeprazole daily.   Please be sure medication list is accurate. If a new problem present, please set up appointment sooner than planned today.

## 2018-09-07 ENCOUNTER — Other Ambulatory Visit: Payer: Self-pay | Admitting: Internal Medicine

## 2018-09-14 ENCOUNTER — Other Ambulatory Visit: Payer: Self-pay | Admitting: *Deleted

## 2018-09-14 MED ORDER — ALLOPURINOL 300 MG PO TABS: 300.0000 mg | ORAL_TABLET | Freq: Every day | ORAL | 0 refills | Status: DC

## 2018-09-30 ENCOUNTER — Other Ambulatory Visit: Payer: Self-pay | Admitting: Internal Medicine

## 2018-09-30 ENCOUNTER — Ambulatory Visit: Payer: Managed Care, Other (non HMO) | Admitting: Gastroenterology

## 2018-09-30 MED ORDER — BUPROPION HCL ER (XL) 300 MG PO TB24: 300.0000 mg | ORAL_TABLET | Freq: Every day | ORAL | 0 refills | Status: DC

## 2018-09-30 NOTE — Telephone Encounter (Signed)
Requested medication (s) are due for refill today -yes  Requested medication (s) are on the active medication list -yes  Future visit scheduled -yes  Last refill: 08/12/17  Notes to clinic: Patient is requesting refill of medication that passes protocol- except depression screening. He has appointment April. Sent for refill because prescribed by provider not in office.  Requested Prescriptions  Pending Prescriptions Disp Refills   buPROPion (WELLBUTRIN XL) 300 MG 24 hr tablet 90 tablet 3    Sig: Take 1 tablet (300 mg total) by mouth daily.     Psychiatry: Antidepressants - bupropion Failed - 09/30/2018  1:58 PM      Failed - Completed PHQ-2 or PHQ-9 in the last 360 days.      Passed - Last BP in normal range    BP Readings from Last 1 Encounters:  09/02/18 120/78         Passed - Valid encounter within last 6 months    Recent Outpatient Visits          4 weeks ago Hypersomnia with sleep apnea   Therapist, music at [facility] [name], Malka So, MD   3 months ago Acute pain of right shoulder   Therapist, music at [facility] [name], Malka So, MD   12 months ago Hypersomnia with sleep apnea   Therapist, music at [facility], Doretha Sou, MD   1 year ago Current moderate episode of major depressive disorder without prior episode [facility])   Therapist, music at [facility] [facility], Doretha Sou, MD   1 year ago Shortness of breath   Therapist, music at [facility] [employer], [place], NP      Future Appointments            In 2 months [name], Malka So, MD [employer] at [place], [facility]            Requested Prescriptions  Pending Prescriptions Disp Refills   buPROPion (WELLBUTRIN XL) 300 MG 24 hr tablet 90 tablet 3    Sig: Take 1 tablet (300 mg total) by mouth daily.     Psychiatry: Antidepressants - bupropion Failed - 09/30/2018  1:58 PM      Failed - Completed PHQ-2 or PHQ-9 in the last 360 days.      Passed - Last BP in normal range    BP Readings from Last  1 Encounters:  09/02/18 120/78         Passed - Valid encounter within last 6 months    Recent Outpatient Visits          4 weeks ago Hypersomnia with sleep apnea   Therapist, music at [facility] [name], Malka So, MD   3 months ago Acute pain of right shoulder   Therapist, music at [facility] [name], Malka So, MD   12 months ago Hypersomnia with sleep apnea   Therapist, music at [employer], Doretha Sou, MD   1 year ago Current moderate episode of major depressive disorder without prior episode [name] [name])   Therapist, music at [employer], Doretha Sou, MD   1 year ago Shortness of breath   Therapist, music at [employer], [place], NP      Future Appointments            In 2 months [name], Malka So, MD [employer] at [place], [facility]

## 2018-09-30 NOTE — Telephone Encounter (Signed)
Copied from [place] [id number]. Topic: Quick Communication - Rx Refill/Question >> Sep 30, 2018  1:55 PM Rayann Heman wrote: Medication: buPROPion (WELLBUTRIN XL) 300 MG 24 hr tablet [[id number]]   Has the patient contacted their pharmacy? no  Preferred Pharmacy (with phone number or street name): [id number] - [place], [place] [phone] (Phone) [phone] (Fax)   Agent: Please be advised that RX refills may take up to 3 business days. We ask that you follow-up with your pharmacy.

## 2018-10-05 ENCOUNTER — Other Ambulatory Visit: Payer: Self-pay | Admitting: *Deleted

## 2018-10-05 MED ORDER — BUPROPION HCL ER (XL) 300 MG PO TB24: 300.0000 mg | ORAL_TABLET | Freq: Every day | ORAL | 0 refills | Status: DC

## 2018-10-06 ENCOUNTER — Ambulatory Visit (INDEPENDENT_AMBULATORY_CARE_PROVIDER_SITE_OTHER): Payer: Managed Care, Other (non HMO) | Admitting: Pulmonary Disease

## 2018-10-06 ENCOUNTER — Encounter: Payer: Self-pay | Admitting: Pulmonary Disease

## 2018-10-06 ENCOUNTER — Ambulatory Visit (INDEPENDENT_AMBULATORY_CARE_PROVIDER_SITE_OTHER)
Admission: RE | Admit: 2018-10-06 | Discharge: 2018-10-06 | Disposition: A | Discharge status: Home / Self Care | Payer: Managed Care, Other (non HMO) | Source: Ambulatory Visit | Attending: Pulmonary Disease | Admitting: Pulmonary Disease

## 2018-10-06 VITALS — BP 124/76 | HR 87 | Ht 70.0 in | Wt 250.0 lb

## 2018-10-06 DIAGNOSIS — G478 Other sleep disorders: Secondary | ICD-10-CM

## 2018-10-06 DIAGNOSIS — R0602 Shortness of breath: Secondary | ICD-10-CM

## 2018-10-06 NOTE — Progress Notes (Signed)
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 ACCU-CHEK AVIVA PLUS test strip USE TO CHECK ONCE DAILY  . allopurinol (ZYLOPRIM) 300 MG tablet Take 1 tablet (300 mg total) by mouth daily.  Marland Kitchen buPROPion (WELLBUTRIN XL) 300 MG 24 hr tablet Take 1 tablet (300 mg total) by mouth daily.  Marland Kitchen escitalopram (LEXAPRO) 20 MG tablet TAKE 1 TABLET BY MOUTH EVERY DAY  . omeprazole (PRILOSEC) 20 MG capsule Take 1 capsule (20 mg total) by mouth daily.   No facility-administered encounter medications on file as of 10/06/2018.     Allergies as of 10/06/2018 - Review Complete 10/06/2018  Allergen Reaction Noted  . Hydrocodone-acetaminophen    . Sulfamethoxazole      Past Medical History:  Diagnosis Date  . Hx of gout   . Hx of sinusitis   . Inflammation of foot due to trauma     Past Surgical History:  Procedure Laterality Date  . back surgery  2005  . FOOT SURGERY  1992  . fusion c6-7     dr Larose Hires  . HERNIA REPAIR    . KNEE ARTHROSCOPY  2003  . lumber fusion     dr cabel  . NECK SURGERY  2005  . SPINE SURGERY      No family history on file.  Social History   Socioeconomic History  . Marital status: Married   Spouse name: Not on file  . Number of children: Not on file  . Years of education: Not on file  . Highest education level: Not on file  Occupational History  . Not on file  Social Needs  . Financial resource strain: Not on file  . Food insecurity:    Worry: Not on file    Inability: Not on file  . Transportation needs:    Medical: Not on file    Non-medical: Not on file  Tobacco Use  . Smoking status: Former Smoker    Packs/day: 0.50    Types: Cigarettes    Start date: 10/06/1972    Last attempt to quit: 03/19/2015    Years since quitting: 3.5  . Smokeless tobacco: Never Used  Substance and Sexual Activity  . Alcohol use: Yes    Alcohol/week: 1.0 standard drinks    Types: 1 Standard drinks or equivalent per week  . Drug use: No  . Sexual activity: Yes  Lifestyle  . Physical activity:    Days per week: Not on file    Minutes per session: Not on file  . Stress: Not on file  Relationships  . Social connections:    Talks on phone: Not on file  Gets together: Not on file    Attends religious service: Not on file    Active member of club or organization: Not on file    Attends meetings of clubs or organizations: Not on file    Relationship status: Not on file  . Intimate partner violence:    Fear of current or ex partner: Not on file    Emotionally abused: Not on file    Physically abused: Not on file    Forced sexual activity: Not on file  Other Topics Concern  . Not on file  Social History Narrative  . Not on file    Review of Systems  Constitutional: Negative.   HENT: Negative.   Eyes: Negative.   Respiratory: Positive for apnea.   Cardiovascular: Negative for chest pain.  Gastrointestinal: Negative.   Psychiatric/Behavioral: Positive for sleep disturbance.  All other systems reviewed and are negative.   Vitals:   10/06/18 1528  BP: 124/76  Pulse: 87  SpO2: 97%     Physical Exam  Constitutional: He appears well-developed and well-nourished.  HENT:    Head: Normocephalic and atraumatic.  Eyes: Pupils are equal, round, and reactive to light. Conjunctivae and EOM are normal. Right eye exhibits no discharge. Left eye exhibits no discharge.  Neck: Normal range of motion. Neck supple. No tracheal deviation present. No thyromegaly present.  Cardiovascular: Normal rate and regular rhythm.  Pulmonary/Chest: Effort normal and breath sounds normal. No respiratory distress. He has no wheezes. He has no rales.  Abdominal: Soft. Bowel sounds are normal. He exhibits no distension. There is no abdominal tenderness.   Results of the Epworth flowsheet 10/06/2018  Sitting and reading 2  Watching TV 3  Sitting, inactive in a public place (e.g. a theatre or a meeting) 0  As a passenger in a car for an hour without a break 0  Lying down to rest in the afternoon when circumstances permit 3  Sitting and talking to someone 3  Sitting quietly after a lunch without alcohol 3  In a car, while stopped for a few minutes in traffic 0  Total score 14    Assessment:  Shortness of breath  History of obstructive sleep apnea -Suboptimal treatment with oral device -Excessive daytime sleepiness  Obesity  Plan/Recommendations:  Pathophysiology of sleep disordered breathing discussed with the patient  Treatment options discussed with the patient  For shortness of breath we will obtain a pulmonary function study as he did smoke in the past, obtain an echocardiogram to assess for reason for shortness of breath Obtain BMP and CBC  Weight loss and exercise was discussed  I will see him back in the office in about 3 months, encouraged to call with any significant concerns   Sherrilyn Rist MD [facility] 10/06/2018, 3:37 PM  CC: Marletta Lor, MD

## 2018-10-06 NOTE — Patient Instructions (Signed)
Shortness of breath Obstructive sleep apnea  We will get a home sleep study Obtain chest x-ray Obtain PFT Obtain echocardiogram BMP and CBC-blood work  I will see back in the office in about 1 to 3 months   Sleep Apnea Sleep apnea is a condition in which breathing pauses or becomes shallow during sleep. Episodes of sleep apnea usually last 10 seconds or longer, and they may occur as many as 20 times an hour. Sleep apnea disrupts your sleep and keeps your body from getting the rest that it needs. This condition can increase your risk of certain health problems, including:  Heart attack.  Stroke.  Obesity.  Diabetes.  Heart failure.  Irregular heartbeat. There are three kinds of sleep apnea:  Obstructive sleep apnea. This kind is caused by a blocked or collapsed airway.  Central sleep apnea. This kind happens when the part of the brain that controls breathing does not send the correct signals to the muscles that control breathing.  Mixed sleep apnea. This is a combination of obstructive and central sleep apnea. What are the causes? The most common cause of this condition is a collapsed or blocked airway. An airway can collapse or become blocked if:  Your throat muscles are abnormally relaxed.  Your tongue and tonsils are larger than normal.  You are overweight.  Your airway is smaller than normal. What increases the risk? This condition is more likely to develop in people who:  Are overweight.  Smoke.  Have a smaller than normal airway.  Are elderly.  Are male.  Drink alcohol.  Take sedatives or tranquilizers.  Have a family history of sleep apnea. What are the signs or symptoms? Symptoms of this condition include:  Trouble staying asleep.  Daytime sleepiness and tiredness.  Irritability.  Loud snoring.  Morning headaches.  Trouble concentrating.  Forgetfulness.  Decreased interest in sex.  Unexplained sleepiness.  Mood  swings.  Personality changes.  Feelings of depression.  Waking up often during the night to urinate.  Dry mouth.  Sore throat. How is this diagnosed? This condition may be diagnosed with:  A medical history.  A physical exam.  A series of tests that are done while you are sleeping (sleep study). These tests are usually done in a sleep lab, but they may also be done at home. How is this treated? Treatment for this condition aims to restore normal breathing and to ease symptoms during sleep. It may involve managing health issues that can affect breathing, such as high blood pressure or obesity. Treatment may include:  Sleeping on your side.  Using a decongestant if you have nasal congestion.  Avoiding the use of depressants, including alcohol, sedatives, and narcotics.  Losing weight if you are overweight.  Making changes to your diet.  Quitting smoking.  Using a device to open your airway while you sleep, such as: ? An oral appliance. This is a custom-made mouthpiece that shifts your lower jaw forward. ? A continuous positive airway pressure (CPAP) device. This device delivers oxygen to your airway through a mask. ? A nasal expiratory positive airway pressure (EPAP) device. This device has valves that you put into each nostril. ? A bi-level positive airway pressure (BPAP) device. This device delivers oxygen to your airway through a mask.  Surgery if other treatments do not work. During surgery, excess tissue is removed to create a wider airway. It is important to get treatment for sleep apnea. Without treatment, this condition can lead to:  High blood  pressure.  Coronary artery disease.  (Men) An inability to achieve or maintain an erection (impotence).  Reduced thinking abilities. Follow these instructions at home:  Make any lifestyle changes that your health care provider recommends.  Eat a healthy, well-balanced diet.  Take over-the-counter and prescription  medicines only as told by your health care provider.  Avoid using depressants, including alcohol, sedatives, and narcotics.  Take steps to lose weight if you are overweight.  If you were given a device to open your airway while you sleep, use it only as told by your health care provider.  Do not use any tobacco products, such as cigarettes, chewing tobacco, and e-cigarettes. If you need help quitting, ask your health care provider.  Keep all follow-up visits as told by your health care provider. This is important. Contact a health care provider if:  The device that you received to open your airway during sleep is uncomfortable or does not seem to be working.  Your symptoms do not improve.  Your symptoms get worse. Get help right away if:  You develop chest pain.  You develop shortness of breath.  You develop discomfort in your back, arms, or stomach.  You have trouble speaking.  You have weakness on one side of your body.  You have drooping in your face. These symptoms may represent a serious problem that is an emergency. Do not wait to see if the symptoms will go away. Get medical help right away. Call your local emergency services (911 in the [place] Do not drive yourself to the hospital. This information is not intended to replace advice given to you by your health care provider. Make sure you discuss any questions you have with your health care provider. Document Released: 07/17/2002 Document Revised: 02/22/2017 Document Reviewed: 05/06/2015 Elsevier Interactive Patient Education  2019 [employer].

## 2018-10-07 ENCOUNTER — Encounter: Payer: Self-pay | Admitting: Physician Assistant

## 2018-10-07 ENCOUNTER — Ambulatory Visit (INDEPENDENT_AMBULATORY_CARE_PROVIDER_SITE_OTHER): Payer: Managed Care, Other (non HMO) | Admitting: Physician Assistant

## 2018-10-07 VITALS — BP 120/80 | HR 80 | Ht 70.7 in | Wt 250.0 lb

## 2018-10-07 DIAGNOSIS — K219 Gastro-esophageal reflux disease without esophagitis: Secondary | ICD-10-CM

## 2018-10-07 DIAGNOSIS — R0602 Shortness of breath: Secondary | ICD-10-CM

## 2018-10-07 DIAGNOSIS — Z1212 Encounter for screening for malignant neoplasm of rectum: Secondary | ICD-10-CM

## 2018-10-07 DIAGNOSIS — R079 Chest pain, unspecified: Secondary | ICD-10-CM

## 2018-10-07 DIAGNOSIS — Z1211 Encounter for screening for malignant neoplasm of colon: Secondary | ICD-10-CM | POA: Diagnosis not present

## 2018-10-07 LAB — CBC WITH DIFFERENTIAL/PLATELET
BASOS PCT: 0.9 % (ref 0.0–3.0)
Basophils Absolute: 0.1 10*3/uL (ref 0.0–0.1)
Eosinophils Absolute: 0.2 10*3/uL (ref 0.0–0.7)
Eosinophils Relative: 2.1 % (ref 0.0–5.0)
HEMATOCRIT: 43.9 % (ref 39.0–52.0)
Hemoglobin: 15.4 g/dL (ref 13.0–17.0)
Lymphocytes Relative: 31.4 % (ref 12.0–46.0)
Lymphs Abs: 2.9 10*3/uL (ref 0.7–4.0)
MCHC: 34.9 g/dL (ref 30.0–36.0)
MCV: 93.2 fl (ref 78.0–100.0)
Monocytes Absolute: 0.8 10*3/uL (ref 0.1–1.0)
Monocytes Relative: 8.9 % (ref 3.0–12.0)
Neutro Abs: 5.2 10*3/uL (ref 1.4–7.7)
Neutrophils Relative %: 56.7 % (ref 43.0–77.0)
Platelets: 270 10*3/uL (ref 150.0–400.0)
RBC: 4.71 Mil/uL (ref 4.22–5.81)
RDW: 12.5 % (ref 11.5–15.5)
WBC: 9.2 10*3/uL (ref 4.0–10.5)

## 2018-10-07 LAB — BASIC METABOLIC PANEL
BUN: 18 mg/dL (ref 6–23)
CHLORIDE: 103 meq/L (ref 96–112)
CO2: 24 mEq/L (ref 19–32)
Calcium: 9.4 mg/dL (ref 8.4–10.5)
Creatinine, Ser: 0.9 mg/dL (ref 0.40–1.50)
GFR: 85.63 mL/min (ref >60.00)
Glucose, Bld: 113 mg/dL — ABNORMAL HIGH (ref 70–99)
Potassium: 4.3 mEq/L (ref 3.5–5.1)
Sodium: 136 mEq/L (ref 135–145)

## 2018-10-07 MED ORDER — OMEPRAZOLE 40 MG PO CPDR: 40.0000 mg | DELAYED_RELEASE_CAPSULE | Freq: Every day | ORAL | 3 refills | Status: DC

## 2018-10-07 NOTE — Patient Instructions (Signed)
We have sent the following medications to your pharmacy for you to pick up at your convenience: Omeprazole 40 mg daily  We will contact you on Tuesday or Wednesday of next week to see how you are feeling.  Please follow up with [name], PA-C in 4-6 weeks.  If you are age 62 or older, your body mass index should be between 23-30. Your Body mass index is 35.16 kg/m. If this is out of the aforementioned range listed, please consider follow up with your Primary Care Provider.  If you are age 35 or younger, your body mass index should be between 19-25. Your Body mass index is 35.16 kg/m. If this is out of the aformentioned range listed, please consider follow up with your Primary Care Provider.
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
## 2018-10-07 NOTE — Progress Notes (Signed)
Reviewed. I agree with documentation including the assessment and plan.  Kaisyn Reinhold L. Faatima Tench, MD, MPH 

## 2018-10-10 ENCOUNTER — Ambulatory Visit (HOSPITAL_COMMUNITY): Payer: Managed Care, Other (non HMO) | Attending: Internal Medicine

## 2018-10-10 DIAGNOSIS — R0602 Shortness of breath: Secondary | ICD-10-CM | POA: Diagnosis present

## 2018-10-11 ENCOUNTER — Other Ambulatory Visit: Payer: Self-pay

## 2018-10-11 DIAGNOSIS — R079 Chest pain, unspecified: Secondary | ICD-10-CM

## 2018-10-12 ENCOUNTER — Telehealth: Payer: Self-pay | Admitting: Physician Assistant

## 2018-10-12 NOTE — Telephone Encounter (Signed)
Pt came to discuss endo/colon. Was told to go to pulmonary and they sent him somewhere else. The patient sounds very confused and would like to know what he has to do to do the procedure.

## 2018-10-12 NOTE — Telephone Encounter (Signed)
The pt was advised that he will be contacted by cardiology for an appt.  The pt has been advised of the information and verbalized understanding.

## 2018-10-18 ENCOUNTER — Encounter: Payer: Self-pay | Admitting: Cardiology

## 2018-10-18 ENCOUNTER — Ambulatory Visit (INDEPENDENT_AMBULATORY_CARE_PROVIDER_SITE_OTHER): Payer: Managed Care, Other (non HMO) | Admitting: Cardiology

## 2018-10-18 ENCOUNTER — Encounter: Payer: Self-pay | Admitting: *Deleted

## 2018-10-18 VITALS — BP 114/80 | HR 75 | Ht 70.7 in | Wt 251.4 lb

## 2018-10-18 DIAGNOSIS — R079 Chest pain, unspecified: Secondary | ICD-10-CM

## 2018-10-18 DIAGNOSIS — R0609 Other forms of dyspnea: Secondary | ICD-10-CM | POA: Diagnosis not present

## 2018-10-18 DIAGNOSIS — G4733 Obstructive sleep apnea (adult) (pediatric): Secondary | ICD-10-CM
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
## 2018-10-18 NOTE — Patient Instructions (Signed)
Medication Instructions:  The current medical regimen is effective;  continue present plan and medications.  If you need a refill on your cardiac medications before your next appointment, please call your pharmacy.   Testing/Procedures: Your physician has requested that you have a myoview. For further information please visit [email or web address]. Please follow instruction sheet, as given.  Follow-Up: Follow up will be determined based upon the above testing.  Thank you for choosing [facility]!!

## 2018-10-19 ENCOUNTER — Telehealth (HOSPITAL_COMMUNITY): Payer: Self-pay | Admitting: *Deleted

## 2018-10-19 NOTE — Telephone Encounter (Signed)
Patient given detailed instructions per Myocardial Perfusion Study Information Sheet for the test on 10/21/18 at 10:00. Patient notified to arrive 15 minutes early and that it is imperative to arrive on time for appointment to keep from having the test rescheduled.  If you need to cancel or reschedule your appointment, please call the office within 24 hours of your appointment. . Patient verbalized understanding.Debby Freiberg Brooks\

## 2018-10-21 ENCOUNTER — Ambulatory Visit (HOSPITAL_COMMUNITY): Payer: Managed Care, Other (non HMO) | Attending: Cardiovascular Disease

## 2018-10-21 ENCOUNTER — Other Ambulatory Visit: Payer: Self-pay

## 2018-10-21 DIAGNOSIS — R079 Chest pain, unspecified: Secondary | ICD-10-CM | POA: Insufficient documentation

## 2018-10-21 LAB — MYOCARDIAL PERFUSION IMAGING
LV dias vol: 115 mL (ref 62–150)
LVSYSVOL: 51 mL
Peak HR: 144 {beats}/min
Rest HR: 75 {beats}/min
SDS: 1
SRS: 0
SSS: 1
TID: 0.87

## 2018-10-21 MED ORDER — TECHNETIUM TC 99M TETROFOSMIN IV KIT
32.6000 | PACK | Freq: Once | INTRAVENOUS | Status: AC | PRN
  Administered 2018-10-21: 32.6 via INTRAVENOUS
  Filled 2018-10-21: qty 33

## 2018-10-21 MED ORDER — TECHNETIUM TC 99M TETROFOSMIN IV KIT
9.8000 | PACK | Freq: Once | INTRAVENOUS | Status: AC | PRN
  Administered 2018-10-21: 9.8 via INTRAVENOUS
  Filled 2018-10-21: qty 10

## 2018-10-24 ENCOUNTER — Telehealth: Payer: Self-pay

## 2018-10-24 NOTE — Telephone Encounter (Signed)
-----   Message from [name], MD sent at 10/24/2018  8:53 AM EDT ----- Overall low risk study, no ischemia. Normal EF.  Reassuring.  Candee Furbish, MD

## 2018-10-24 NOTE — Telephone Encounter (Signed)
Notes recorded by Frederik Schmidt, RN on 10/24/2018 at 9:13 AM EDT Lpm that results are normal and that if he has questions, please call.3/16 ------

## 2018-10-25 ENCOUNTER — Telehealth: Payer: Self-pay | Admitting: Pulmonary Disease

## 2018-10-25 NOTE — Telephone Encounter (Signed)
Called and spoke with patient, advised him that he will need to have the surgical forms sent to [place] to have [place] fill them out. Patient advised that he will get the forms and have them sent to [place]. Will await forms.

## 2018-10-26 ENCOUNTER — Telehealth: Payer: Self-pay | Admitting: Pulmonary Disease

## 2018-10-26 NOTE — Telephone Encounter (Signed)
error 

## 2018-10-27 NOTE — Telephone Encounter (Signed)
Patient aware forms are needed for surgical clearance.  Nothing further at this time.

## 2018-11-01 ENCOUNTER — Telehealth: Payer: Self-pay | Admitting: Physician Assistant

## 2018-11-01 NOTE — Telephone Encounter (Signed)
Dottie do you by chance have the clearance?

## 2018-11-01 NOTE — Telephone Encounter (Signed)
I just wanted to make sure you had not seen anything. I had not gotten a response.  Thanks

## 2018-11-01 NOTE — Telephone Encounter (Signed)
I was never asked to get clearance. Rather, patient was told that Jennifer's nurse would contact him as per her office note on 10/10/18....  "Explained that I will have my nurse call him on Tuesday or Wednesday of next week to check in with him per pulmonology's recommendations after echocardiogram.  I would be inclined to send him a referral to cardiology based on his current complaints.  If cardiac work-up is negative/normal then could pursue further GI evaluation with possible EGD in addition to screening colonoscopy."  Looks like he was to get pulmonary and cardiac clearance and then follow up with Anderson Malta in 4 weeks after speaking with nurse.

## 2018-11-01 NOTE — Telephone Encounter (Signed)
Anderson Malta please review pulmonology and cardiology notes for clearance.

## 2018-11-01 NOTE — Telephone Encounter (Signed)
Pt called with some questions about his clearance that he was to get before scheduling an endo/colon

## 2018-11-02 ENCOUNTER — Telehealth: Payer: Self-pay | Admitting: Pulmonary Disease

## 2018-11-02 NOTE — Telephone Encounter (Signed)
Patient has had the stress test, but no clearance.  He sees Pulmonary on Monday.  He will contact cardiology to obtain clearance

## 2018-11-02 NOTE — Telephone Encounter (Signed)
Call returned to patient wife she states since starting the reflux medication is has helped his chest discomfort. She states he is also not sleeping at all. She would like to discuss over the phone. Also made aware HST are being put off until July. Requesting virtual visit to discuss labs, cxr, and echo as well as recommendations for sleep since sleep study in unavailable to do at this time. Appt made. Nothing further is needed at this time.

## 2018-11-02 NOTE — Telephone Encounter (Signed)
He was supposed to be scheduled for stress test per cards- after this they can clear him. Please let him know this is what we are waiting on.  Thanks-JLL

## 2018-11-02 NOTE — Telephone Encounter (Signed)
Left message for patient to call back  

## 2018-11-03 ENCOUNTER — Telehealth: Payer: Self-pay | Admitting: Physician Assistant

## 2018-11-03 NOTE — Telephone Encounter (Signed)
See result note.  

## 2018-11-03 NOTE — Telephone Encounter (Signed)
Pt called stating that he was returning your call. Pls call him again.

## 2018-11-04 ENCOUNTER — Telehealth: Payer: Self-pay | Admitting: Primary Care

## 2018-11-04 ENCOUNTER — Ambulatory Visit (INDEPENDENT_AMBULATORY_CARE_PROVIDER_SITE_OTHER): Payer: Managed Care, Other (non HMO) | Admitting: Primary Care

## 2018-11-04 ENCOUNTER — Encounter: Payer: Self-pay | Admitting: Primary Care

## 2018-11-04 ENCOUNTER — Ambulatory Visit: Payer: Managed Care, Other (non HMO) | Admitting: Pulmonary Disease

## 2018-11-04 ENCOUNTER — Other Ambulatory Visit: Payer: Self-pay

## 2018-11-04 DIAGNOSIS — K219 Gastro-esophageal reflux disease without esophagitis: Secondary | ICD-10-CM

## 2018-11-04 DIAGNOSIS — G4733 Obstructive sleep apnea (adult) (pediatric): Secondary | ICD-10-CM | POA: Diagnosis not present

## 2018-11-04 DIAGNOSIS — R0602 Shortness of breath: Secondary | ICD-10-CM

## 2018-11-04 NOTE — Telephone Encounter (Signed)
Thank you for letting me know.  Glad to know he is doing better. We will wait on you guys then to reschedule. We are not doing elective procedures at the moment anyway.  Thanks [name], PA-C

## 2018-11-04 NOTE — Telephone Encounter (Signed)
Hi [name] [name],  We have reschedule most of our follow-ups d/t COVID- 19. I spoke with [name] [name] today on the phone for an E-Vist.  I understand you guys are waiting on clearance from [place] and cardiology to proceed with EGD. He still needs PFTs which will likely be moved out to June/July. He is doing well, states improvement in reflux and chest discomfort with increasing his dose of omeprazole to 40mg  daily.   Thanks,  Geraldo Pitter, NP [facility]

## 2018-11-04 NOTE — Patient Instructions (Addendum)
Thank you for allowing me to speak with you today  Sleep apnea - Continue to wear oral appliance - Recommend side sleeping position (try using pillow to keep you on your side) - Recommend weight loss and staying active - Avoid sedating medication and alcohol prior to bedtime as these can worse sleep apnea   Recommendations: - Continue omeprazole 40mg  daily - Follow GERD diet  - Hold on GI referral until follow up with Dr. Ander Slade (they will not be doing procedures any time soon so this is ok to wait on)  Testing reviewed  10/06/18- CXR showed no acute pulmonary process, clear lungs. Stable borderline cardiomegaly.  10/06/18- CBC and BMET normal (glucose 113- elevated) 10/10/18- Echo normal (left ventricle has normal systolic function with an ejection fraction of 60-65%. Mild Pulmonic valve regurgitation)   Follow up: - Home sleep test in 3 months  - Pulmonary function test in 3 months - Office visit with Dr. Ander Slade in 3 months     Sleep Apnea Sleep apnea affects breathing during sleep. It causes breathing to stop for a short time or to become shallow. It can also increase the risk of:  Heart attack.  Stroke.  Being very overweight (obese).  Diabetes.  Heart failure.  Irregular heartbeat. The goal of treatment is to help you breathe normally again. What are the causes? There are three kinds of sleep apnea:  Obstructive sleep apnea. This is caused by a blocked or collapsed airway.  Central sleep apnea. This happens when the brain does not send the right signals to the muscles that control breathing.  Mixed sleep apnea. This is a combination of obstructive and central sleep apnea. The most common cause of this condition is a collapsed or blocked airway. This can happen if:  Your throat muscles are too relaxed.  Your tongue and tonsils are too large.  You are overweight.  Your airway is too small. What increases the risk?  Being overweight.  Smoking.  Having a  small airway.  Being older.  Being male.  Drinking alcohol.  Taking medicines to calm yourself (sedatives or tranquilizers).  Having family members with the condition. What are the signs or symptoms?  Trouble staying asleep.  Being sleepy or tired during the day.  Getting angry a lot.  Loud snoring.  Headaches in the morning.  Not being able to focus your mind (concentrate).  Forgetting things.  Less interest in sex.  Mood swings.  Personality changes.  Feelings of sadness (depression).  Waking up a lot during the night to pee (urinate).  Dry mouth.  Sore throat. How is this diagnosed?  Your medical history.  A physical exam.  A test that is done when you are sleeping (sleep study). The test is most often done in a sleep lab but may also be done at home. How is this treated?   Sleeping on your side.  Using a medicine to get rid of mucus in your nose (decongestant).  Avoiding the use of alcohol, medicines to help you relax, or certain pain medicines (narcotics).  Losing weight, if needed.  Changing your diet.  Not smoking.  Using a machine to open your airway while you sleep, such as: ? An oral appliance. This is a mouthpiece that shifts your lower jaw forward. ? A CPAP device. This device blows air through a mask when you breathe out (exhale). ? An EPAP device. This has valves that you put in each nostril. ? A BPAP device. This device blows  air through a mask when you breathe in (inhale) and breathe out.  Having surgery if other treatments do not work. It is important to get treatment for sleep apnea. Without treatment, it can lead to:  High blood pressure.  Coronary artery disease.  In men, not being able to have an erection (impotence).  Reduced thinking ability. Follow these instructions at home: Lifestyle  Make changes that your doctor recommends.  Eat a healthy diet.  Lose weight if needed.  Avoid alcohol, medicines to help  you relax, and some pain medicines.  Do not use any products that contain nicotine or tobacco, such as cigarettes, e-cigarettes, and chewing tobacco. If you need help quitting, ask your doctor. General instructions  Take over-the-counter and prescription medicines only as told by your doctor.  If you were given a machine to use while you sleep, use it only as told by your doctor.  If you are having surgery, make sure to tell your doctor you have sleep apnea. You may need to bring your device with you.  Keep all follow-up visits as told by your doctor. This is important. Contact a doctor if:  The machine that you were given to use during sleep bothers you or does not seem to be working.  You do not get better.  You get worse. Get help right away if:  Your chest hurts.  You have trouble breathing in enough air.  You have an uncomfortable feeling in your back, arms, or stomach.  You have trouble talking.  One side of your body feels weak.  A part of your face is hanging down. These symptoms may be an emergency. Do not wait to see if the symptoms will go away. Get medical help right away. Call your local emergency services (911 in the [place] Do not drive yourself to the hospital. Summary  This condition affects breathing during sleep.  The most common cause is a collapsed or blocked airway.  The goal of treatment is to help you breathe normally while you sleep. This information is not intended to replace advice given to you by your health care provider. Make sure you discuss any questions you have with your health care provider. Document Released: 05/05/2008 Document Revised: 03/22/2018 Document Reviewed: 03/22/2018 Elsevier Interactive Patient Education  2019 [place] for Gastroesophageal Reflux Disease, Adult When you have gastroesophageal reflux disease (GERD), the foods you eat and your eating habits are very important. Choosing the right foods can  help ease your discomfort. Think about working with a nutrition specialist (dietitian) to help you make good choices. What are tips for following this plan?  Meals  Choose healthy foods that are low in fat, such as fruits, vegetables, whole grains, low-fat dairy products, and lean meat, fish, and poultry.  Eat small meals often instead of 3 large meals a day. Eat your meals slowly, and in a place where you are relaxed. Avoid bending over or lying down until 2-3 hours after eating.  Avoid eating meals 2-3 hours before bed.  Avoid drinking a lot of liquid with meals.  Cook foods using methods other than frying. Bake, grill, or broil food instead.  Avoid or limit: ? Chocolate. ? Peppermint or spearmint. ? Alcohol. ? Pepper. ? Black and decaffeinated coffee. ? Black and decaffeinated tea. ? Bubbly (carbonated) soft drinks. ? Caffeinated energy drinks and soft drinks.  Limit high-fat foods such as: ? Fatty meat or fried foods. ? Whole milk, cream, butter, or ice  cream. ? Nuts and nut butters. ? Pastries, donuts, and sweets made with butter or shortening.  Avoid foods that cause symptoms. These foods may be different for everyone. Common foods that cause symptoms include: ? Tomatoes. ? Oranges, lemons, and limes. ? Peppers. ? Spicy food. ? Onions and garlic. ? Vinegar. Lifestyle  Maintain a healthy weight. Ask your doctor what weight is healthy for you. If you need to lose weight, work with your doctor to do so safely.  Exercise for at least 30 minutes for 5 or more days each week, or as told by your doctor.  Wear loose-fitting clothes.  Do not smoke. If you need help quitting, ask your doctor.  Sleep with the head of your bed higher than your feet. Use a wedge under the mattress or blocks under the bed frame to raise the head of the bed. Summary  When you have gastroesophageal reflux disease (GERD), food and lifestyle choices are very important in easing your  symptoms.  Eat small meals often instead of 3 large meals a day. Eat your meals slowly, and in a place where you are relaxed.  Limit high-fat foods such as fatty meat or fried foods.  Avoid bending over or lying down until 2-3 hours after eating.  Avoid peppermint and spearmint, caffeine, alcohol, and chocolate. This information is not intended to replace advice given to you by your health care provider. Make sure you discuss any questions you have with your health care provider. Document Released: 01/26/2012 Document Revised: 09/01/2016 Document Reviewed: 09/01/2016 Elsevier Interactive Patient Education  2019 [place] (COVID-19) Are you at risk?  Are you at risk for the Coronavirus (COVID-19)?  To be considered HIGH RISK for Coronavirus (COVID-19), you have to meet the following criteria:  . Traveled to [place], [place], [place], [place] or [place]; or in the [place] to [place], [place], [place], or [place]; and have fever, cough, and shortness of breath within the last 2 weeks of travel OR . Been in close contact with a person diagnosed with COVID-19 within the last 2 weeks and have fever, cough, and shortness of breath . IF YOU DO NOT MEET THESE CRITERIA, YOU ARE CONSIDERED LOW RISK FOR COVID-19.  What to do if you are HIGH RISK for COVID-19?  Marland Kitchen If you are having a medical emergency, call 911. . Seek medical care right away. Before you go to a doctor's office, urgent care or emergency department, call ahead and tell them about your recent travel, contact with someone diagnosed with COVID-19, and your symptoms. You should receive instructions from your physician's office regarding next steps of care.  . When you arrive at healthcare provider, tell the healthcare staff immediately you have returned from visiting [place], [place], [place], [place] or [place]; or traveled in the [place] to [place], [place], [place], or [place]; in the last two  weeks or you have been in close contact with a person diagnosed with COVID-19 in the last 2 weeks.   . Tell the health care staff about your symptoms: fever, cough and shortness of breath. . After you have been seen by a medical provider, you will be either: o Tested for (COVID-19) and discharged home on quarantine except to seek medical care if symptoms worsen, and asked to  - Stay home and avoid contact with others until you get your results (4-5 days)  - Avoid travel on public transportation if possible (such as bus, train, or  airplane) or o Sent to the Emergency Department by EMS for evaluation, COVID-19 testing, and possible admission depending on your condition and test results.  What to do if you are LOW RISK for COVID-19?  Reduce your risk of any infection by using the same precautions used for avoiding the common cold or flu:  Marland Kitchen Wash your hands often with soap and warm water for at least 20 seconds.  If soap and water are not readily available, use an alcohol-based hand sanitizer with at least 60% alcohol.  . If coughing or sneezing, cover your mouth and nose by coughing or sneezing into the elbow areas of your shirt or coat, into a tissue or into your sleeve (not your hands). . Avoid shaking hands with others and consider head nods or verbal greetings only. . Avoid touching your eyes, nose, or mouth with unwashed hands.  . Avoid close contact with people who are sick. . Avoid places or events with large numbers of people in one location, like concerts or sporting events. . Carefully consider travel plans you have or are making. . If you are planning any travel outside or inside the [place], visit the CDC's Travelers' Health webpage for the latest health notices. . If you have some symptoms but not all symptoms, continue to monitor at home and seek medical attention if your symptoms worsen. . If you are having a medical emergency, call 911.   [place] / e-Visit: [email or web address]         [facility]: [facility]: [phone]                   [facility]: [phone]

## 2018-11-04 NOTE — Progress Notes (Signed)
Virtual Visit via Telephone Note  I connected with [name] [name] on 11/04/18 at  9:00 AM EDT by telephone and verified that I am speaking with the correct person using two identifiers.   I discussed the limitations, risks, security and privacy concerns of performing an evaluation and management service by telephone and the availability of in person appointments. I also discussed with the patient that there may be a patient responsible charge related to this service. The patient expressed understanding and agreed to proceed.  Patient at home, agreeing to E-[facility]. Myself and [name] [name] present on phone call. My nurse lisa to set up follow-up and mail AVS.  History of Present Illness: 63 year old male, former smoker. PMH significant for bronchitis, chronic sinusitis, hypersomnia with sleep apnea, anxiety. Patient of Dr. Ander Slade, seen in office on 10/06/18 for consult for shortness of breath. Patient uses an oral device for sleep apnea. No PFTs on record. Ordered for CXR, ECHO and PFTs.   11/04/2018 Patient called today for 1 month follow-up/reviewed recent testing. Still waiting on Pulmonary function testing and home sleep test, this will likely be pushed out to June/July in light of recent COVID-19 outbreak. Patient is doing well. States that he uses oral appliance for hx OSA but reports that he chews on mouth guard at night.  Discussed side sleeping position and weight loss to help control symptoms. States that he will start out sleeping on his side and will eventually end up on his back. His wife will wake him up when she notices him snoring or not breathing. Patient states that when he lost weight he slept much better. Since increasing omeprazole his reflux and chest discomfort have improved. Continues to have some shortness of breath with exertion. No significant cough or wheezing. Worked in [employer]. States that he was told he had mucus plug in the past, not evident on most recent CXR. Recommend  mucinex and cough/deep breathing exercises. No reported fevers.   Observations/Objective:  Observations: - No shortness of breath or wheezing noted during phone coversation  Testing: 10/06/18- CXR showed no acute pulmonary process, clear lungs. Stable borderline cardiomegaly.  10/06/18- CBC and BMET normal (glucose 113- elevated) 10/10/18- Echo normal (left ventricle has normal systolic function with an ejection fraction of 60-65%. Mild Pulmonic valve regurgitation)   Assessment and Plan:  OSA: - Continue oral appliance - Encourage weight loss  - Recommend side sleeping position (try using pillows to prevent rolling onto back) - Needs HST, this is being pushed out to June/July d/t COVID  - FU with Dr. Ander Slade in 3 months   SOB: - CXR, ECHO and labs essentially all normal - Needs PFTs - Encourage weight loss and increasing physical activity  GERD: - Continue omeprazole 40mg  daily - Recommend GERD diet (printed educational material for patient to review) - Follow with GI, possible EGD after PFTs    Follow Up Instructions:  - FU in 3 months with Dr. Ander Slade - Needs HST and PFTs (June/July)   I discussed the assessment and treatment plan with the patient. The patient was provided an opportunity to ask questions and all were answered. The patient agreed with the plan and demonstrated an understanding of the instructions.   The patient was advised to call back or seek an in-person evaluation if the symptoms worsen or if the condition fails to improve as anticipated.  I provided 30 minutes of non-face-to-face time during this encounter.   Martyn Ehrich, NP

## 2018-11-07 ENCOUNTER — Ambulatory Visit: Payer: Managed Care, Other (non HMO) | Admitting: Pulmonary Disease

## 2018-12-02 ENCOUNTER — Ambulatory Visit (INDEPENDENT_AMBULATORY_CARE_PROVIDER_SITE_OTHER): Payer: Managed Care, Other (non HMO) | Admitting: Family Medicine

## 2018-12-02 ENCOUNTER — Encounter: Payer: Self-pay | Admitting: Family Medicine

## 2018-12-02 ENCOUNTER — Other Ambulatory Visit: Payer: Self-pay

## 2018-12-02 VITALS — HR 66 | Resp 16

## 2018-12-02 DIAGNOSIS — M1A00X0 Idiopathic chronic gout, unspecified site, without tophus (tophi): Secondary | ICD-10-CM | POA: Diagnosis not present

## 2018-12-02 DIAGNOSIS — F411 Generalized anxiety disorder: Secondary | ICD-10-CM | POA: Diagnosis not present

## 2018-12-02 DIAGNOSIS — K219 Gastro-esophageal reflux disease without esophagitis: Secondary | ICD-10-CM | POA: Diagnosis not present

## 2018-12-02 DIAGNOSIS — Z6835 Body mass index (BMI) 35.0-35.9, adult: Secondary | ICD-10-CM

## 2018-12-02 DIAGNOSIS — F339 Major depressive disorder, recurrent, unspecified: Secondary | ICD-10-CM | POA: Diagnosis not present

## 2018-12-02 DIAGNOSIS — R5382 Chronic fatigue, unspecified: Secondary | ICD-10-CM

## 2018-12-02 MED ORDER — ALLOPURINOL 100 MG PO TABS: 100.0000 mg | ORAL_TABLET | Freq: Every day | ORAL | 1 refills | Status: DC

## 2018-12-02 MED ORDER — ESCITALOPRAM OXALATE 20 MG PO TABS: 20.0000 mg | ORAL_TABLET | Freq: Every day | ORAL | 1 refills | Status: DC

## 2018-12-02 MED ORDER — BUPROPION HCL ER (XL) 300 MG PO TB24: 300.0000 mg | ORAL_TABLET | Freq: Every day | ORAL | 1 refills | Status: DC

## 2018-12-02 NOTE — Assessment & Plan Note (Signed)
Problem is well controlled with omeprazole 40 mg daily. Continue GERD precautions. No changes in current management.

## 2018-12-02 NOTE — Assessment & Plan Note (Signed)
He has been asymptomatic for 3 to 4 years. Recommend decreasing allopurinol dose from 150 mg to 100 mg. Continue low purine diet. Follow-up in 3 to 4 months.
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
## 2018-12-02 NOTE — Assessment & Plan Note (Signed)
We discussed possible etiologies. Most likely related to OSA. Some of his older chronic medical problems could also contribute to problem. If he thinks Lexapro or Wellbutrin are contributing to problem, he can continue taking meds at bedtime.

## 2018-12-02 NOTE — Assessment & Plan Note (Signed)
Problem is well controlled with Lexapro 20 mg. No changes in current management.

## 2018-12-02 NOTE — Assessment & Plan Note (Signed)
We discussed benefits of wt loss as well as adverse effects of obesity. Consistency with healthy diet and physical activity recommended.  

## 2018-12-02 NOTE — Assessment & Plan Note (Signed)
Denies depressed mood, problem is stable. No changes in Wellbutrin XR 300 mg daily.

## 2018-12-15 ENCOUNTER — Ambulatory Visit: Payer: Managed Care, Other (non HMO)

## 2018-12-15 ENCOUNTER — Other Ambulatory Visit: Payer: Self-pay

## 2018-12-15 DIAGNOSIS — G478 Other sleep disorders: Secondary | ICD-10-CM

## 2018-12-15 DIAGNOSIS — G4733 Obstructive sleep apnea (adult) (pediatric): Secondary | ICD-10-CM

## 2018-12-16 DIAGNOSIS — G4733 Obstructive sleep apnea (adult) (pediatric): Secondary | ICD-10-CM

## 2019-01-03 ENCOUNTER — Telehealth: Payer: Self-pay | Admitting: Primary Care

## 2019-01-03 DIAGNOSIS — G4733 Obstructive sleep apnea (adult) (pediatric): Secondary | ICD-10-CM

## 2019-01-03 NOTE — Telephone Encounter (Signed)
Called and spoke with pt letting him know the results of the HST and stated due to the results we are going to start him on cpap treatment. Stated to pt that we were going to get him set up with a DME which will call him to schedule appt to pick up machine and all equipment and stated to him that we would get him to come in for an appt for compliance after being started on CPAP and pt expressed understanding. Pt has been scheduled for an appt with Derl Barrow for after CPAP start and order has been placed for the new DME. Nothing further needed.

## 2019-01-03 NOTE — Telephone Encounter (Signed)
Patient is returning phone call.  Patient phone number is [phone].

## 2019-01-03 NOTE — Telephone Encounter (Signed)
LMTCB

## 2019-01-03 NOTE — Telephone Encounter (Signed)
Pt called requesting to know the results of HST which was performed 12/23/2018. Beth, please advise on this for pt. Thanks!

## 2019-01-03 NOTE — Telephone Encounter (Signed)
HST  showed moderate obstructive sleep apnea AHI 22.1. SpaO2 low 78%, average 93%. Recommend start CPAP auto titrate 5-15cm h20, humidification, mask of choice and supplies. Needs follow up in 31-90 days after starting.

## 2019-01-06 ENCOUNTER — Other Ambulatory Visit: Payer: Self-pay | Admitting: Physician Assistant

## 2019-03-17 ENCOUNTER — Ambulatory Visit: Payer: Managed Care, Other (non HMO) | Admitting: Primary Care

## 2019-03-21 ENCOUNTER — Encounter: Payer: Self-pay | Admitting: Primary Care

## 2019-03-21 ENCOUNTER — Other Ambulatory Visit: Payer: Self-pay

## 2019-03-21 ENCOUNTER — Ambulatory Visit (INDEPENDENT_AMBULATORY_CARE_PROVIDER_SITE_OTHER): Payer: Managed Care, Other (non HMO) | Admitting: Primary Care

## 2019-03-21 DIAGNOSIS — R0602 Shortness of breath: Secondary | ICD-10-CM

## 2019-03-21 DIAGNOSIS — Z9989 Dependence on other enabling machines and devices: Secondary | ICD-10-CM | POA: Diagnosis not present

## 2019-03-21 DIAGNOSIS — G4733 Obstructive sleep apnea (adult) (pediatric): Secondary | ICD-10-CM

## 2019-03-21 NOTE — Progress Notes (Signed)
Virtual Visit via Telephone Note  I connected with [name] [name] on 03/21/19 at 10:30 AM EDT by telephone and verified that I am speaking with the correct person using two identifiers.  Location: Patient: Home Provider: Office   I discussed the limitations, risks, security and privacy concerns of performing an evaluation and management service by telephone and the availability of in person appointments. I also discussed with the patient that there may be a patient responsible charge related to this service. The patient expressed understanding and agreed to proceed.   History of Present Illness: 63 year old male, former smoker. PMH significant for bronchitis, chronic sinusitis, hypersomnia with sleep apnea, anxiety. Patient of Dr. Ander Slade, seen in office on 10/06/18 for consult for shortness of breath. Patient uses an oral device for sleep apnea. No PFTs on record. Ordered for CXR, ECHO and PFTs.   Previous [facility] pulmonary encounters: 11/04/2018 Patient called today for 1 month follow-up/reviewed recent testing. Still waiting on Pulmonary function testing and home sleep test, this will likely be pushed out to June/July in light of recent COVID-19 outbreak. Patient is doing well. States that he uses oral appliance for hx OSA but reports that he chews on mouth guard at night.  Discussed side sleeping position and weight loss to help control symptoms. States that he will start out sleeping on his side and will eventually end up on his back. His wife will wake him up when she notices him snoring or not breathing. Patient states that when he lost weight he slept much better. Since increasing omeprazole his reflux and chest discomfort have improved. Continues to have some shortness of breath with exertion. No significant cough or wheezing. Worked in [employer]. States that he was told he had mucus plug in the past, not evident on most recent CXR. Recommend mucinex and cough/deep breathing exercises. No  reported fevers.  03/21/2019  Patient contacted today for televisit/ OSA follow-up. HST on 12/23/18 showed moderate obstructive sleep apnea AHI 22.1. Recommend start CPAP auto titrate 5-15cm h2O. Patient is doing well, he is compliant with CPAP. Sleeping very well with CPAP. He's not falling asleep during the day and doesn't have brain fog issues. Out of work right now d/t back. Stays up late at night and sleeping in a little due to irregular scheduled. Planning on returning to work in September. Still has some shortness of breath with talking or singing. Described as strain in his voice. States that he is out of shape. He is walking a little bit.   Testing reviewed  10/06/18- CXR showed no acute pulmonary process, clear lungs. Stable borderline cardiomegaly.  10/06/18- CBC and BMET normal (glucose 113- elevated) 10/10/18- Echo normal (left ventricle has normal systolic function with an ejection fraction of 60-65%. Mild Pulmonic valve regurgitation)    Observations/Objective:  - No shortness of breath, wheezing or cough noted during phone conversation  Assessment and Plan:  OSA - HST in May showed moderate obstructive sleep apnea - CPAP therapy initiated, patient is 100% compliant and reports benefit with use - Pressure 5-15cm h20; AHI 1.0  - No changes to settings  - Continue to recommended weight loss - Advised not to drive if experiencing excessive daytime fatigue and avoid sedating medication and alcohol prior to bedtime as these can worse sleep apnea  Shortness of breath on exertion - Mild SOB with talking/singing  - CXR showed clear lungs, borderline cardiomegaly  - Still needs PFTs (patient would like to wait to fall) - Suspect mostly d/t  weight and deconditioning   Follow Up Instructions:  - FU 3 months with Dr. Ander Slade and PFTs    I discussed the assessment and treatment plan with the patient. The patient was provided an opportunity to ask questions and all were answered. The  patient agreed with the plan and demonstrated an understanding of the instructions.   The patient was advised to call back or seek an in-person evaluation if the symptoms worsen or if the condition fails to improve as anticipated.  I provided 25 minutes of non-face-to-face time during this encounter.   Martyn Ehrich, NP

## 2019-03-21 NOTE — Patient Instructions (Addendum)
Excellent work wearing CPAP  Continue to wear every night for 4-6 hours or more  Do not drive if experiencing excessive daytime fatigue or somnolence   No changes today  Follow up in 6 months with Dr. Ander Slade    CPAP and BPAP Information CPAP and BPAP are methods of helping a person breathe with the use of air pressure. CPAP stands for "continuous positive airway pressure." BPAP stands for "bi-level positive airway pressure." In both methods, air is blown through your nose or mouth and into your air passages to help you breathe well. CPAP and BPAP use different amounts of pressure to blow air. With CPAP, the amount of pressure stays the same while you breathe in and out. With BPAP, the amount of pressure is increased when you breathe in (inhale) so that you can take larger breaths. Your health care provider will recommend whether CPAP or BPAP would be more helpful for you. Why are CPAP and BPAP treatments used? CPAP or BPAP can be helpful if you have:  Sleep apnea.  Chronic obstructive pulmonary disease (COPD).  Heart failure.  Medical conditions that weaken the muscles of the chest including muscular dystrophy, or neurological diseases such as amyotrophic lateral sclerosis (ALS).  Other problems that cause breathing to be weak, abnormal, or difficult. CPAP is most commonly used for obstructive sleep apnea (OSA) to keep the airways from collapsing when the muscles relax during sleep. How is CPAP or BPAP administered? Both CPAP and BPAP are provided by a small machine with a flexible plastic tube that attaches to a plastic mask. You wear the mask. Air is blown through the mask into your nose or mouth. The amount of pressure that is used to blow the air can be adjusted on the machine. Your health care provider will determine the pressure setting that should be used based on your individual needs. When should CPAP or BPAP be used? In most cases, the mask only needs to be worn during sleep.  Generally, the mask needs to be worn throughout the night and during any daytime naps. People with certain medical conditions may also need to wear the mask at other times when they are awake. Follow instructions from your health care provider about when to use the machine. What are some tips for using the mask?   Because the mask needs to be snug, some people feel trapped or closed-in (claustrophobic) when first using the mask. If you feel this way, you may need to get used to the mask. One way to do this is by holding the mask loosely over your nose or mouth and then gradually applying the mask more snugly. You can also gradually increase the amount of time that you use the mask.  Masks are available in various types and sizes. Some fit over your mouth and nose while others fit over just your nose. If your mask does not fit well, talk with your health care provider about getting a different one.  If you are using a mask that fits over your nose and you tend to breathe through your mouth, a chin strap may be applied to help keep your mouth closed.  The CPAP and BPAP machines have alarms that may sound if the mask comes off or develops a leak.  If you have trouble with the mask, it is very important that you talk with your health care provider about finding a way to make the mask easier to tolerate. Do not stop using the mask. Stopping  the use of the mask could have a negative impact on your health. What are some tips for using the machine?  Place your CPAP or BPAP machine on a secure table or stand near an electrical outlet.  Know where the on/off switch is located on the machine.  Follow instructions from your health care provider about how to set the pressure on your machine and when you should use it.  Do not eat or drink while the CPAP or BPAP machine is on. Food or fluids could get pushed into your lungs by the pressure of the CPAP or BPAP.  Do not smoke. Tobacco smoke residue can damage  the machine.  For home use, CPAP and BPAP machines can be rented or purchased through home health care companies. Many different brands of machines are available. Renting a machine before purchasing may help you find out which particular machine works well for you.  Keep the CPAP or BPAP machine and attachments clean. Ask your health care provider for specific instructions. Get help right away if:  You have redness or open areas around your nose or mouth where the mask fits.  You have trouble using the CPAP or BPAP machine.  You cannot tolerate wearing the CPAP or BPAP mask.  You have pain, discomfort, and bloating in your abdomen. Summary  CPAP and BPAP are methods of helping a person breathe with the use of air pressure.  Both CPAP and BPAP are provided by a small machine with a flexible plastic tube that attaches to a plastic mask.  If you have trouble with the mask, it is very important that you talk with your health care provider about finding a way to make the mask easier to tolerate. This information is not intended to replace advice given to you by your health care provider. Make sure you discuss any questions you have with your health care provider. Document Released: 04/24/2004 Document Revised: 11/16/2018 Document Reviewed: 06/15/2016 Elsevier Patient Education  2020 [employer].

## 2019-04-12 ENCOUNTER — Telehealth: Payer: Self-pay | Admitting: Pulmonary Disease

## 2019-04-12 NOTE — Telephone Encounter (Signed)
LMTCB x1 for pt.  Pt is enrolled in [place].

## 2019-04-12 NOTE — Telephone Encounter (Signed)
Patient is returning phone call.  Patient phone number is [phone]. °

## 2019-04-12 NOTE — Telephone Encounter (Signed)
Spoke with the pt  She states that he is needing copy of CPAP DL faxed to [facility] recent DL from [place] and faxed over for him  Nothing further needed

## 2019-08-07 ENCOUNTER — Other Ambulatory Visit: Payer: Self-pay | Admitting: Family Medicine

## 2019-08-19 ENCOUNTER — Other Ambulatory Visit: Payer: Self-pay | Admitting: Family Medicine

## 2019-08-19 DIAGNOSIS — M1A00X0 Idiopathic chronic gout, unspecified site, without tophus (tophi): Secondary | ICD-10-CM

## 2019-08-25 ENCOUNTER — Other Ambulatory Visit: Payer: Self-pay | Admitting: Gastroenterology

## 2019-09-18 ENCOUNTER — Other Ambulatory Visit: Payer: Self-pay

## 2019-09-18 MED ORDER — ESCITALOPRAM OXALATE 20 MG PO TABS: 20.0000 mg | ORAL_TABLET | Freq: Every day | ORAL | 0 refills | Status: DC

## 2019-09-18 MED ORDER — BUPROPION HCL ER (XL) 300 MG PO TB24: 300.0000 mg | ORAL_TABLET | Freq: Every day | ORAL | 0 refills | Status: DC

## 2019-10-04 ENCOUNTER — Other Ambulatory Visit: Payer: Self-pay | Admitting: Pulmonary Disease

## 2019-10-04 DIAGNOSIS — R0602 Shortness of breath: Secondary | ICD-10-CM

## 2019-12-21 ENCOUNTER — Other Ambulatory Visit: Payer: Self-pay | Admitting: Family Medicine

## 2019-12-25 NOTE — Telephone Encounter (Signed)
Patient need to schedule an ov for more refills. 

## 2020-01-02 ENCOUNTER — Other Ambulatory Visit: Payer: Self-pay

## 2020-01-03 ENCOUNTER — Ambulatory Visit (INDEPENDENT_AMBULATORY_CARE_PROVIDER_SITE_OTHER): Payer: BC Managed Care – PPO | Admitting: Family Medicine

## 2020-01-03 VITALS — BP 130/78 | HR 71 | Temp 97.9°F | Wt 250.2 lb

## 2020-01-03 DIAGNOSIS — R42 Dizziness and giddiness: Secondary | ICD-10-CM

## 2020-01-03 DIAGNOSIS — G4733 Obstructive sleep apnea (adult) (pediatric): Secondary | ICD-10-CM | POA: Diagnosis not present

## 2020-01-03 DIAGNOSIS — Z8349 Family history of other endocrine, nutritional and metabolic diseases: Secondary | ICD-10-CM | POA: Diagnosis not present

## 2020-01-03 DIAGNOSIS — R5383 Other fatigue: Secondary | ICD-10-CM

## 2020-01-03 LAB — CBC WITH DIFFERENTIAL/PLATELET
Basophils Absolute: 0 10*3/uL (ref 0.0–0.1)
Basophils Relative: 0.7 % (ref 0.0–3.0)
Eosinophils Absolute: 0.2 10*3/uL (ref 0.0–0.7)
Eosinophils Relative: 2.2 % (ref 0.0–5.0)
HCT: 41.2 % (ref 39.0–52.0)
Hemoglobin: 14.2 g/dL (ref 13.0–17.0)
Lymphocytes Relative: 35.7 % (ref 12.0–46.0)
Lymphs Abs: 2.5 10*3/uL (ref 0.7–4.0)
MCHC: 34.4 g/dL (ref 30.0–36.0)
MCV: 92.6 fl (ref 78.0–100.0)
Monocytes Absolute: 0.5 10*3/uL (ref 0.1–1.0)
Monocytes Relative: 7.8 % (ref 3.0–12.0)
Neutro Abs: 3.8 10*3/uL (ref 1.4–7.7)
Neutrophils Relative %: 53.6 % (ref 43.0–77.0)
Platelets: 236 10*3/uL (ref 150.0–400.0)
RBC: 4.45 Mil/uL (ref 4.22–5.81)
RDW: 12.6 % (ref 11.5–15.5)
WBC: 7 10*3/uL (ref 4.0–10.5)

## 2020-01-03 LAB — BASIC METABOLIC PANEL
BUN: 12 mg/dL (ref 6–23)
CO2: 27 mEq/L (ref 19–32)
Calcium: 9.5 mg/dL (ref 8.4–10.5)
Chloride: 103 mEq/L (ref 96–112)
Creatinine, Ser: 0.8 mg/dL (ref 0.40–1.50)
GFR: 97.71 mL/min (ref >60.00)
Glucose, Bld: 128 mg/dL — ABNORMAL HIGH (ref 70–99)
Potassium: 4 mEq/L (ref 3.5–5.1)
Sodium: 135 mEq/L (ref 135–145)

## 2020-01-03 LAB — HEPATIC FUNCTION PANEL
ALT: 26 U/L (ref 0–53)
AST: 18 U/L (ref 0–37)
Albumin: 4.5 g/dL (ref 3.5–5.2)
Alkaline Phosphatase: 74 U/L (ref 39–117)
Bilirubin, Direct: 0.1 mg/dL (ref 0.0–0.3)
Total Bilirubin: 0.4 mg/dL (ref 0.2–1.2)
Total Protein: 6.7 g/dL (ref 6.0–8.3)

## 2020-01-03 LAB — TSH: TSH: 2.42 u[IU]/mL (ref 0.35–4.50)

## 2020-01-03 LAB — VITAMIN B12: Vitamin B-12: 369 pg/mL (ref 211–911)

## 2020-01-03 LAB — FERRITIN: Ferritin: 220.2 ng/mL (ref 22.0–322.0)
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
## 2020-01-03 NOTE — Patient Instructions (Signed)

## 2020-01-04 LAB — IRON, TOTAL/TOTAL IRON BINDING CAP
%SAT: 16 % (calc) — ABNORMAL LOW (ref 20–48)
Iron: 51 ug/dL (ref 50–180)
TIBC: 326 mcg/dL (calc) (ref 250–425)

## 2020-02-21 ENCOUNTER — Encounter: Payer: Self-pay | Admitting: Family Medicine

## 2020-02-21 ENCOUNTER — Ambulatory Visit (INDEPENDENT_AMBULATORY_CARE_PROVIDER_SITE_OTHER): Payer: BC Managed Care – PPO | Admitting: Family Medicine

## 2020-02-21 ENCOUNTER — Other Ambulatory Visit: Payer: Self-pay

## 2020-02-21 VITALS — BP 126/80 | HR 75 | Temp 98.0°F | Resp 16 | Ht 70.0 in | Wt 245.4 lb

## 2020-02-21 DIAGNOSIS — Z23 Encounter for immunization: Secondary | ICD-10-CM | POA: Diagnosis not present

## 2020-02-21 DIAGNOSIS — Z125 Encounter for screening for malignant neoplasm of prostate: Secondary | ICD-10-CM

## 2020-02-21 DIAGNOSIS — E782 Mixed hyperlipidemia: Secondary | ICD-10-CM

## 2020-02-21 DIAGNOSIS — Z131 Encounter for screening for diabetes mellitus: Secondary | ICD-10-CM | POA: Diagnosis not present

## 2020-02-21 DIAGNOSIS — Z0000 Encounter for general adult medical examination without abnormal findings: Secondary | ICD-10-CM | POA: Diagnosis not present

## 2020-02-21 DIAGNOSIS — Z1159 Encounter for screening for other viral diseases: Secondary | ICD-10-CM

## 2020-02-21 DIAGNOSIS — F339 Major depressive disorder, recurrent, unspecified: Secondary | ICD-10-CM

## 2020-02-21 HISTORY — DX: Mixed hyperlipidemia: E78.2
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
## 2020-02-21 NOTE — Patient Instructions (Addendum)
A few things to remember from today's visit:   Routine general medical examination at a health care facility  Mixed hyperlipidemia  Encounter for HCV screening test for low risk patient - Plan: Hepatitis C antibody screen  Prostate cancer screening - Plan: PSA(Must document that pt has been informed of limitations of PSA testing.)  Diabetes mellitus screening - Plan: Hemoglobin A1c  At least 150 minutes of moderate exercise per week, daily brisk walking for 15-30 min is a good exercise option. Healthy diet low in saturated (animal) fats and sweets and consisting of fresh fruits and vegetables, lean meats such as fish and white chicken and whole grains. Decrease beer intake.  - Vaccines:  Tdap vaccine every 10 years.  Shingles vaccine recommended at age 26, could be given after 63 years of age but not sure about insurance coverage.  Pneumonia vaccines: Pneumovax at 39   -Screening recommendations for low/normal risk males:  Screening for diabetes at age 17 and every 3 years. Earlier screening if cardiovascular risk factors.   Lipid screening at 35 and every 3 years. Screening starts in younger males with cardiovascular risk factors. N/A  Colon cancer screening is now at age 48 but your insurance may not cover until age 12 .screening is recommended age 17.  Prostate cancer screening: some controversy, starts usually at 16: Rectal exam and PSA.  Aortic Abdominal Aneurism once between 78 and 15 years old if ever smoker.  Also recommended:  1. Dental visit- Brush and floss your teeth twice daily; visit your dentist twice a year. 2. Eye doctor- Get an eye exam at least every 2 years. 3. Helmet use- Always wear a helmet when riding a bicycle, motorcycle, rollerblading or skateboarding. 4. Safe sex- If you may be exposed to sexually transmitted infections, use a condom. 5. Seat belts- Seat belts can save your live; always wear one. 6. Smoke/Carbon Monoxide detectors- These  detectors need to be installed on the appropriate level of your home. Replace batteries at least once a year. 7. Skin cancer- When out in the sun please cover up and use sunscreen 15 SPF or higher. 8. Violence- If anyone is threatening or hurting you, please tell your healthcare provider.  9. Drink alcohol in moderation- Limit alcohol intake to one drink or less per day. Never drink and drive.  If you need refills please call your pharmacy. Do not use My Chart to request refills or for acute issues that need immediate attention.    Please be sure medication list is accurate. If a new problem present, please set up appointment sooner than planned today.

## 2020-02-22 LAB — LIPID PANEL
Cholesterol: 208 mg/dL — ABNORMAL HIGH (ref <200)
HDL: 45 mg/dL (ref >=40)
LDL Cholesterol (Calc): 124 mg/dL (calc) — ABNORMAL HIGH
Non-HDL Cholesterol (Calc): 163 mg/dL (calc) — ABNORMAL HIGH (ref <130)
Total CHOL/HDL Ratio: 4.6 (calc) (ref <5.0)
Triglycerides: 251 mg/dL — ABNORMAL HIGH (ref <150)

## 2020-02-22 LAB — HEMOGLOBIN A1C
Hgb A1c MFr Bld: 5.8 % of total Hgb — ABNORMAL HIGH (ref <5.7)
Mean Plasma Glucose: 120 (calc)
eAG (mmol/L): 6.6 (calc)

## 2020-02-22 LAB — PSA: PSA: 0.7 ng/mL (ref <=4.0)

## 2020-02-22 LAB — HEPATITIS C ANTIBODY
Hepatitis C Ab: NONREACTIVE
SIGNAL TO CUT-OFF: 0.01 (ref <1.00)

## 2020-03-15 ENCOUNTER — Other Ambulatory Visit: Payer: Self-pay | Admitting: Family Medicine

## 2020-03-15 DIAGNOSIS — M1A00X0 Idiopathic chronic gout, unspecified site, without tophus (tophi): Secondary | ICD-10-CM

## 2020-05-06 ENCOUNTER — Other Ambulatory Visit: Payer: Self-pay | Admitting: Family Medicine

## 2020-05-16 DIAGNOSIS — G4733 Obstructive sleep apnea (adult) (pediatric): Secondary | ICD-10-CM | POA: Diagnosis not present

## 2020-07-31 DIAGNOSIS — M25562 Pain in left knee: Secondary | ICD-10-CM | POA: Diagnosis not present

## 2020-08-05 ENCOUNTER — Other Ambulatory Visit: Payer: Self-pay | Admitting: Family Medicine

## 2020-08-22 ENCOUNTER — Telehealth: Payer: Self-pay | Admitting: Family Medicine

## 2020-08-22 ENCOUNTER — Telehealth (INDEPENDENT_AMBULATORY_CARE_PROVIDER_SITE_OTHER): Payer: BC Managed Care – PPO | Admitting: Family Medicine

## 2020-08-22 DIAGNOSIS — U071 COVID-19: Secondary | ICD-10-CM | POA: Diagnosis not present

## 2020-08-22 DIAGNOSIS — R0981 Nasal congestion: Secondary | ICD-10-CM | POA: Diagnosis not present

## 2020-08-22 DIAGNOSIS — R059 Cough, unspecified: Secondary | ICD-10-CM

## 2020-08-22 MED ORDER — BENZONATATE 100 MG PO CAPS: 100.0000 mg | ORAL_CAPSULE | Freq: Three times a day (TID) | ORAL | 0 refills | Status: DC | PRN

## 2020-08-22 MED ORDER — AMOXICILLIN-POT CLAVULANATE 875-125 MG PO TABS: 1.0000 | ORAL_TABLET | Freq: Two times a day (BID) | ORAL | 0 refills | Status: DC

## 2020-08-22 NOTE — Patient Instructions (Addendum)
  HOME CARE TIPS:  -[facility] testing information: [email or web address].org/ OR [phone] Most pharmacies also offer testing and home test kits.  -I sent the medication(s) we discussed to your pharmacy: Meds ordered this encounter  Medications  . amoxicillin-clavulanate (AUGMENTIN) 875-125 MG tablet    Sig: Take 1 tablet by mouth 2 (two) times daily.    Dispense:  20 tablet    Refill:  0  . benzonatate (TESSALON PERLES) 100 MG capsule    Sig: Take 1 capsule (100 mg total) by mouth 3 (three) times daily as needed.    Dispense:  20 capsule    Refill:  0     -can use tylenol if needed for fevers, aches and pains per instructions  -can use nasal saline a few times per day if nasal congestion, sometime a short course of Afrin nasal spray for 3 days can help as well  -stay hydrated, drink plenty of fluids and eat small healthy meals - avoid dairy  -Vit D 1000 IU daily and Vit C500mg  daily  -follow up with your doctor in 2-3 days unless improving and feeling better  -stay home while sick, except to seek medical care, and if you have COVID19 please stay home for a full 10 days since the onset of symptoms PLUS one day of no fever and feeling better.  It was nice to meet you today, and I really hope you are feeling better soon. I help [facility] out with telemedicine visits on Tuesdays and Thursdays and am available for visits on those days. If you have any concerns or questions following this visit please schedule a follow up visit with your Primary Care doctor or seek care at a local urgent care clinic to avoid delays in care.    Seek in person care promptly if your symptoms worsen, new concerns arise or you are not improving with treatment. Call 911 and/or seek emergency care if you symptoms are severe or life threatening.

## 2020-08-22 NOTE — Telephone Encounter (Signed)
Spoke with the pt and informed him of the message below.  Offered a virtual visit with another provider on 1/14.  Patient declined as he prefers to see Dr [name] and an appt was scheduled for 1/17.

## 2020-08-22 NOTE — Telephone Encounter (Signed)
Patient is calling and stated that he did a virtual with Dr. Maudie Mercury this morning and wanted to let her know he did test positive and wanted to know what to do next, please advise. CB is [phone]

## 2020-08-22 NOTE — Telephone Encounter (Signed)
Please let him know I am sorry he is sick and that he has covid. Ok to stop the antibiotic as we discussed and follow instructions on his patient instructions. If he has further questions or wants to consider and discuss referral for treatment (though very unlikely he would be scheduled due to severe treatment shortage) please schedule him a follow up virtual visit with PCP, me or another doc tomorrow. Thank you.
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
## 2020-08-23 ENCOUNTER — Telehealth (INDEPENDENT_AMBULATORY_CARE_PROVIDER_SITE_OTHER): Payer: BC Managed Care – PPO | Admitting: Family Medicine

## 2020-08-23 ENCOUNTER — Encounter: Payer: Self-pay | Admitting: Family Medicine

## 2020-08-23 ENCOUNTER — Telehealth: Payer: Self-pay | Admitting: Family Medicine

## 2020-08-23 VITALS — Ht 70.0 in

## 2020-08-23 DIAGNOSIS — U071 COVID-19: Secondary | ICD-10-CM

## 2020-08-23 DIAGNOSIS — R0981 Nasal congestion: Secondary | ICD-10-CM | POA: Diagnosis not present

## 2020-08-23 DIAGNOSIS — R059 Cough, unspecified: Secondary | ICD-10-CM | POA: Diagnosis not present

## 2020-08-23 DIAGNOSIS — H9313 Tinnitus, bilateral: Secondary | ICD-10-CM

## 2020-08-23 NOTE — Telephone Encounter (Signed)
Patient is scared that his Covid symptoms are going to get worse before talking to Dr. [name].  He is requesting a call back.

## 2020-08-23 NOTE — Telephone Encounter (Signed)
I spoke with pt. Appointment moved to today at 4:30.
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
## 2020-08-26 ENCOUNTER — Telehealth: Payer: BC Managed Care – PPO | Admitting: Family Medicine

## 2020-08-27 ENCOUNTER — Telehealth: Payer: BC Managed Care – PPO | Admitting: Family Medicine

## 2020-10-31 ENCOUNTER — Other Ambulatory Visit: Payer: Self-pay | Admitting: Family Medicine

## 2020-11-15 DIAGNOSIS — G4733 Obstructive sleep apnea (adult) (pediatric): Secondary | ICD-10-CM | POA: Diagnosis not present

## 2020-12-02 ENCOUNTER — Other Ambulatory Visit: Payer: Self-pay

## 2020-12-02 ENCOUNTER — Ambulatory Visit (INDEPENDENT_AMBULATORY_CARE_PROVIDER_SITE_OTHER): Payer: BC Managed Care – PPO | Admitting: Family Medicine

## 2020-12-02 ENCOUNTER — Encounter: Payer: Self-pay | Admitting: Family Medicine

## 2020-12-02 VITALS — BP 132/80 | HR 84 | Temp 97.9°F | Wt 236.7 lb

## 2020-12-02 DIAGNOSIS — J209 Acute bronchitis, unspecified: Secondary | ICD-10-CM

## 2020-12-02 NOTE — Patient Instructions (Signed)
Acute Bronchitis, Adult  Acute bronchitis is sudden or acute swelling of the air tubes (bronchi) in the lungs. Acute bronchitis causes these tubes to fill with mucus, which can make it hard to breathe. It can also cause coughing or wheezing. In adults, acute bronchitis usually goes away within 2 weeks. A cough caused by bronchitis may last up to 3 weeks. Smoking, allergies, and asthma can make the condition worse. What are the causes? This condition can be caused by germs and by substances that irritate the lungs, including:  Cold and flu viruses. The most common cause of this condition is the virus that causes the common cold.  Bacteria.  Substances that irritate the lungs, including: ? Smoke from cigarettes and other forms of tobacco. ? Dust and pollen. ? Fumes from chemical products, gases, or burned fuel. ? Other materials that pollute indoor or outdoor air.  Close contact with someone who has acute bronchitis. What increases the risk? The following factors may make you more likely to develop this condition:  A weak body's defense system, also called the immune system.  A condition that affects your lungs and breathing, such as asthma. What are the signs or symptoms? Common symptoms of this condition include:  Lung and breathing problems, such as: ? Coughing. This may bring up clear, yellow, or green mucus from your lungs (sputum). ? Wheezing. ? Having too much mucus in your lungs (chest congestion). ? Having shortness of breath.  A fever.  Chills.  Aches and pains, including: ? Tightness in your chest and other body aches. ? A sore throat. How is this diagnosed? This condition is usually diagnosed based on:  Your symptoms and medical history.  A physical exam. You may also have other tests, including tests to rule out other conditions, such pneumonia. These tests include:  A test of lung function.  Test of a mucus sample to look for the presence of  bacteria.  Tests to check the oxygen level in your blood.  Blood tests.  Chest X-ray. How is this treated? Most cases of acute bronchitis clear up over time without treatment. Your health care provider may recommend:  Drinking more fluids. This can thin your mucus, which may improve your breathing.  Taking a medicine for a fever or cough.  Using a device that gets medicine into your lungs (inhaler) to help improve breathing and control coughing.  Using a vaporizer or a humidifier. These are machines that add water to the air to help you breathe better. Follow these instructions at home: Activity  Get plenty of rest.  Return to your normal activities as told by your health care provider. Ask your health care provider what activities are safe for you. Lifestyle  Drink enough fluid to keep your urine pale yellow.  Do not drink alcohol.  Do not use any products that contain nicotine or tobacco, such as cigarettes, e-cigarettes, and chewing tobacco. If you need help quitting, ask your health care provider. Be aware that: ? Your bronchitis will get worse if you smoke or breathe in other people's smoke (secondhand smoke). ? Your lungs will heal faster if you quit smoking. General instructions  Take over-the-counter and prescription medicines only as told by your health care provider.  Use an inhaler, vaporizer, or humidifier as told by your health care provider.  If you have a sore throat, gargle with a salt-water mixture 3-4 times a day or as needed. To make a salt-water mixture, completely dissolve -1 tsp (3-6 g)   of salt in 1 cup (237 mL) of warm water.  Keep all follow-up visits as told by your health care provider. This is important.   How is this prevented? To lower your risk of getting this condition again:  Wash your hands often with soap and water. If soap and water are not available, use hand sanitizer.  Avoid contact with people who have cold symptoms.  Try not to  touch your mouth, nose, or eyes with your hands.  Avoid places where there are fumes from chemicals. Breathing these fumes will make your condition worse.  Get the flu shot every year.   Contact a health care provider if:  Your symptoms do not improve after 2 weeks of treatment.  You vomit more than once or twice.  You have symptoms of dehydration such as: ? Dark urine. ? Dry skin or eyes. ? Increased thirst. ? Headaches. ? Confusion. ? Muscle cramps. Get help right away if you:  Cough up blood.  Feel pain in your chest.  Have severe shortness of breath.  Faint or keep feeling like you are going to faint.  Have a severe headache.  Have fever or chills that get worse. These symptoms may represent a serious problem that is an emergency. Do not wait to see if the symptoms will go away. Get medical help right away. Call your local emergency services (911 in the [place] Do not drive yourself to the hospital. Summary  Acute bronchitis is sudden (acute) inflammation of the air tubes (bronchi) between the windpipe and the lungs. In adults, acute bronchitis usually goes away within 2 weeks, although coughing may last 3 weeks or longer  Take over-the-counter and prescription medicines only as told by your health care provider.  Drink enough fluid to keep your urine pale yellow.  Contact a health care provider if your symptoms do not improve after 2 weeks of treatment.  Get help right away if you cough up blood, faint, or have chest pain or shortness of breath. This information is not intended to replace advice given to you by your health care provider. Make sure you discuss any questions you have with your health care provider. Document Revised: 04/10/2019 Document Reviewed: 02/17/2019 Elsevier Patient Education  2021 Elsevier Inc.  
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
## 2020-12-05 ENCOUNTER — Telehealth (INDEPENDENT_AMBULATORY_CARE_PROVIDER_SITE_OTHER): Payer: BC Managed Care – PPO | Admitting: Family Medicine

## 2020-12-05 ENCOUNTER — Encounter: Payer: Self-pay | Admitting: Family Medicine

## 2020-12-05 ENCOUNTER — Other Ambulatory Visit: Payer: Self-pay

## 2020-12-05 DIAGNOSIS — R0981 Nasal congestion: Secondary | ICD-10-CM

## 2020-12-05 DIAGNOSIS — R062 Wheezing: Secondary | ICD-10-CM | POA: Diagnosis not present

## 2020-12-05 DIAGNOSIS — R059 Cough, unspecified: Secondary | ICD-10-CM | POA: Diagnosis not present

## 2020-12-05 MED ORDER — ALBUTEROL SULFATE HFA 108 (90 BASE) MCG/ACT IN AERS: 2.0000 | INHALATION_SPRAY | Freq: Four times a day (QID) | RESPIRATORY_TRACT | 0 refills | Status: DC | PRN

## 2020-12-05 MED ORDER — BENZONATATE 100 MG PO CAPS: 100.0000 mg | ORAL_CAPSULE | Freq: Three times a day (TID) | ORAL | 0 refills | Status: DC | PRN

## 2020-12-05 NOTE — Patient Instructions (Signed)
-  I sent the medication(s) we discussed to your pharmacy: Meds ordered this encounter  Medications  . albuterol (PROAIR HFA) 108 (90 Base) MCG/ACT inhaler    Sig: Inhale 2 puffs into the lungs every 6 (six) hours as needed for wheezing or shortness of breath.    Dispense:  1 each    Refill:  0  . benzonatate (TESSALON PERLES) 100 MG capsule    Sig: Take 1 capsule (100 mg total) by mouth 3 (three) times daily as needed.    Dispense:  20 capsule    Refill:  0   Please seek prompt in person evaluation right away if you are having any difficulty breathing, chest pain, wheezing that is not responding to the inhaler, or you are worsening.  Do not use the inhaler more frequently than per instructions.  If you are feeling that you need it more frequently, please seek in person care immediately.  I hope you are feeling better soon!  Seek in person care promptly if your symptoms worsen, new concerns arise or you are not improving with treatment.  It was nice to meet you today. I help [facility] out with telemedicine visits on Tuesdays and Thursdays and am available for visits on those days. If you have any concerns or questions following this visit please schedule a follow up visit with your Primary Care doctor or seek care at a local urgent care clinic to avoid delays in care.
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
## 2020-12-09 ENCOUNTER — Other Ambulatory Visit: Payer: BC Managed Care – PPO

## 2020-12-09 ENCOUNTER — Other Ambulatory Visit: Payer: Self-pay

## 2020-12-09 ENCOUNTER — Ambulatory Visit (INDEPENDENT_AMBULATORY_CARE_PROVIDER_SITE_OTHER): Payer: BC Managed Care – PPO

## 2020-12-09 ENCOUNTER — Telehealth: Payer: Self-pay | Admitting: Family Medicine

## 2020-12-09 DIAGNOSIS — R059 Cough, unspecified: Secondary | ICD-10-CM | POA: Diagnosis not present

## 2020-12-09 DIAGNOSIS — I517 Cardiomegaly: Secondary | ICD-10-CM | POA: Diagnosis not present

## 2020-12-09 NOTE — Telephone Encounter (Signed)
Okay to place Chest X-ray order? Pt has seen both Dr. Elease Hashimoto & Dr. Maudie Mercury.

## 2020-12-09 NOTE — Telephone Encounter (Signed)
Patient is calling and stated that he was seen ;ast week for bronchitis and is not feeling better. Per patient he is still coughing and gets short winded. Pt is requesting to come in and get a chest xray, please advise. CB is [phone].

## 2020-12-09 NOTE — Telephone Encounter (Signed)
Patient is scheduled for 10:20 today.

## 2020-12-09 NOTE — Telephone Encounter (Signed)
Spoke with Dr. [name], okay to order. Order has been placed, please schedule patient, thanks!

## 2020-12-11 ENCOUNTER — Encounter: Payer: Self-pay | Admitting: Family Medicine

## 2020-12-11 ENCOUNTER — Telehealth (INDEPENDENT_AMBULATORY_CARE_PROVIDER_SITE_OTHER): Payer: BC Managed Care – PPO | Admitting: Family Medicine

## 2020-12-11 ENCOUNTER — Telehealth: Payer: Self-pay | Admitting: Family Medicine

## 2020-12-11 VITALS — Ht 70.0 in

## 2020-12-11 DIAGNOSIS — R059 Cough, unspecified: Secondary | ICD-10-CM | POA: Diagnosis not present

## 2020-12-11 DIAGNOSIS — K219 Gastro-esophageal reflux disease without esophagitis: Secondary | ICD-10-CM | POA: Diagnosis not present

## 2020-12-11 DIAGNOSIS — J301 Allergic rhinitis due to pollen: Secondary | ICD-10-CM | POA: Diagnosis not present

## 2020-12-11 DIAGNOSIS — J989 Respiratory disorder, unspecified: Secondary | ICD-10-CM

## 2020-12-11 MED ORDER — ADVAIR HFA 115-21 MCG/ACT IN AERO: 2.0000 | INHALATION_SPRAY | Freq: Two times a day (BID) | RESPIRATORY_TRACT | 1 refills | Status: DC

## 2020-12-11 MED ORDER — PREDNISONE 20 MG PO TABS: 40.0000 mg | ORAL_TABLET | Freq: Every day | ORAL | 0 refills | Status: AC

## 2020-12-11 NOTE — Telephone Encounter (Signed)
Increased bilateral perihilar and bibasilar interstitial markings. Findings may reflect bronchitic type lung changes, mild edema, versus atypical/viral infection.  Please advise on results from chest x-ray and what can be done for patient. He declined an appointment, since he has had one twice already.

## 2020-12-11 NOTE — Telephone Encounter (Signed)
The patient is calling for his X-Ray results from 12/09/2020.  He seen Dr. Elease Hashimoto in office on 12/02/2020 and seen Dr. Maudie Mercury virtually on 12/05/2020.  He is still not feeling any better and he is getting worried and would like to be contacted today.
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
## 2020-12-11 NOTE — Telephone Encounter (Signed)
See result note.  

## 2020-12-13 NOTE — Progress Notes (Signed)
Patient is scheduled on 05/27.

## 2020-12-13 NOTE — Progress Notes (Signed)
Patient is scheduled for a Virtual follow up on  05/27 @11 

## 2021-01-03 ENCOUNTER — Encounter: Payer: Self-pay | Admitting: Family Medicine

## 2021-01-03 ENCOUNTER — Telehealth (INDEPENDENT_AMBULATORY_CARE_PROVIDER_SITE_OTHER): Payer: BC Managed Care – PPO | Admitting: Family Medicine

## 2021-01-03 ENCOUNTER — Other Ambulatory Visit: Payer: Self-pay

## 2021-01-03 VITALS — BP 124/80 | HR 77 | Resp 16 | Ht 70.0 in | Wt 237.0 lb

## 2021-01-03 DIAGNOSIS — R6889 Other general symptoms and signs: Secondary | ICD-10-CM

## 2021-01-03 DIAGNOSIS — R0602 Shortness of breath: Secondary | ICD-10-CM | POA: Diagnosis not present

## 2021-01-03 DIAGNOSIS — F339 Major depressive disorder, recurrent, unspecified: Secondary | ICD-10-CM

## 2021-01-03 DIAGNOSIS — J989 Respiratory disorder, unspecified: Secondary | ICD-10-CM

## 2021-01-03 DIAGNOSIS — G4733 Obstructive sleep apnea (adult) (pediatric): Secondary | ICD-10-CM | POA: Diagnosis not present
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
## 2021-01-03 NOTE — Patient Instructions (Addendum)
A few things to remember from today's visit:   SOB (shortness of breath) - Plan: Ambulatory referral to Pulmonology  Forgetfulness  Reactive airway disease without asthma  OSA (obstructive sleep apnea)  If you need refills please call your pharmacy. Do not use My Chart to request refills or for acute issues that need immediate attention.   Appt with your pulmonologist will be arranged. Use Advair daily. Rinse your mount. Let me know when you are ready for memory test. Continue Lexapro and Wellbutrin.  Please be sure medication list is accurate. If a new problem present, please set up appointment sooner than planned today.

## 2021-02-18 ENCOUNTER — Telehealth: Payer: Self-pay | Admitting: Family Medicine

## 2021-02-18 MED ORDER — ACCU-CHEK AVIVA PLUS VI STRP: ORAL_STRIP | 2 refills | Status: DC

## 2021-02-18 NOTE — Telephone Encounter (Signed)
Rx sent in as requested. 

## 2021-02-18 NOTE — Telephone Encounter (Signed)
ACCU-CHEK AVIVA PLUS test strip  [id number] [address], [place] - [place] Phone:  [phone]  Fax:  [phone]

## 2021-03-04 ENCOUNTER — Other Ambulatory Visit (HOSPITAL_COMMUNITY): Payer: BC Managed Care – PPO

## 2021-03-07 ENCOUNTER — Ambulatory Visit: Payer: BC Managed Care – PPO | Admitting: Pulmonary Disease

## 2021-04-09 DIAGNOSIS — M25562 Pain in left knee: Secondary | ICD-10-CM | POA: Diagnosis not present

## 2021-05-20 DIAGNOSIS — G4733 Obstructive sleep apnea (adult) (pediatric): Secondary | ICD-10-CM | POA: Diagnosis not present

## 2021-05-28 ENCOUNTER — Ambulatory Visit (HOSPITAL_COMMUNITY)
Admission: EM | Admit: 2021-05-28 | Discharge: 2021-05-28 | Disposition: A | Discharge status: Home / Self Care | Payer: BC Managed Care – PPO | Attending: Emergency Medicine | Admitting: Emergency Medicine

## 2021-05-28 ENCOUNTER — Other Ambulatory Visit: Payer: Self-pay

## 2021-05-28 ENCOUNTER — Encounter (HOSPITAL_COMMUNITY): Payer: Self-pay | Admitting: Emergency Medicine

## 2021-05-28 ENCOUNTER — Ambulatory Visit (INDEPENDENT_AMBULATORY_CARE_PROVIDER_SITE_OTHER): Payer: BC Managed Care – PPO

## 2021-05-28 DIAGNOSIS — J4521 Mild intermittent asthma with (acute) exacerbation: Secondary | ICD-10-CM | POA: Diagnosis not present

## 2021-05-28 DIAGNOSIS — R0602 Shortness of breath: Secondary | ICD-10-CM

## 2021-05-28 DIAGNOSIS — I517 Cardiomegaly: Secondary | ICD-10-CM | POA: Diagnosis not present

## 2021-05-28 MED ORDER — PREDNISONE 20 MG PO TABS: 40.0000 mg | ORAL_TABLET | Freq: Every day | ORAL | 0 refills | Status: AC
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
## 2021-05-28 NOTE — Discharge Instructions (Signed)
Take the prednisone 2 pills daily.  Take this in the morning.  You can take Tylenol and/or Ibuprofen as needed for fever reduction and pain relief.  For cough: honey 1/2 to 1 teaspoon (you can dilute the honey in water or another fluid).  You can use a humidifier for chest congestion and cough.  If you don't have a humidifier, you can sit in the bathroom with the hot shower running.     Go to the Emergency Department if symptoms worsen or do not improve in the next few days.  Follow up with your primary care provider for re-evaluation.

## 2021-05-28 NOTE — ED Triage Notes (Addendum)
Sunday night, patient had difficulty sleeping with cpap.  Since then had soreness in ribs, "thick pressure" doesn't feel like he is "pulling in enough air".  Patient had covid 08/2020.   Patient talks about many long term issues related to covid experience.    2 weeks ago had fever and general aches and discomfort

## 2021-05-31 ENCOUNTER — Other Ambulatory Visit: Payer: Self-pay | Admitting: Family Medicine

## 2021-06-01 ENCOUNTER — Other Ambulatory Visit: Payer: Self-pay | Admitting: Family Medicine

## 2021-06-03 ENCOUNTER — Other Ambulatory Visit: Payer: Self-pay | Admitting: Family Medicine

## 2021-06-25 DIAGNOSIS — M25562 Pain in left knee: Secondary | ICD-10-CM | POA: Diagnosis not present

## 2021-07-06 ENCOUNTER — Emergency Department
Admission: EM | Admit: 2021-07-06 | Discharge: 2021-07-06 | Disposition: A | Discharge status: Home / Self Care | Payer: BC Managed Care – PPO | Attending: Emergency Medicine | Admitting: Emergency Medicine

## 2021-07-06 ENCOUNTER — Emergency Department: Payer: BC Managed Care – PPO

## 2021-07-06 ENCOUNTER — Encounter: Payer: Self-pay | Admitting: Emergency Medicine

## 2021-07-06 ENCOUNTER — Other Ambulatory Visit: Payer: Self-pay

## 2021-07-06 DIAGNOSIS — R0789 Other chest pain: Secondary | ICD-10-CM | POA: Insufficient documentation

## 2021-07-06 DIAGNOSIS — Z5321 Procedure and treatment not carried out due to patient leaving prior to being seen by health care provider: Secondary | ICD-10-CM | POA: Insufficient documentation

## 2021-07-06 DIAGNOSIS — R109 Unspecified abdominal pain: Secondary | ICD-10-CM

## 2021-07-06 DIAGNOSIS — R1011 Right upper quadrant pain: Secondary | ICD-10-CM | POA: Diagnosis not present

## 2021-07-06 LAB — CBC
HCT: 43.2 % (ref 39.0–52.0)
Hemoglobin: 14.9 g/dL (ref 13.0–17.0)
MCH: 32.3 pg (ref 26.0–34.0)
MCHC: 34.5 g/dL (ref 30.0–36.0)
MCV: 93.5 fL (ref 80.0–100.0)
Platelets: 269 10*3/uL (ref 150–400)
RBC: 4.62 MIL/uL (ref 4.22–5.81)
RDW: 12.4 % (ref 11.5–15.5)
WBC: 10.7 10*3/uL — ABNORMAL HIGH (ref 4.0–10.5)
nRBC: 0 % (ref 0.0–0.2)

## 2021-07-06 LAB — COMPREHENSIVE METABOLIC PANEL
ALT: 26 U/L (ref 0–44)
AST: 19 U/L (ref 15–41)
Albumin: 4.3 g/dL (ref 3.5–5.0)
Alkaline Phosphatase: 67 U/L (ref 38–126)
Anion gap: 7 (ref 5–15)
BUN: 20 mg/dL (ref 8–23)
CO2: 24 mmol/L (ref 22–32)
Calcium: 9.1 mg/dL (ref 8.9–10.3)
Chloride: 104 mmol/L (ref 98–111)
Creatinine, Ser: 0.85 mg/dL (ref 0.61–1.24)
GFR, Estimated: >60 mL/min (ref >60)
Glucose, Bld: 99 mg/dL (ref 70–99)
Potassium: 3.8 mmol/L (ref 3.5–5.1)
Sodium: 135 mmol/L (ref 135–145)
Total Bilirubin: 0.8 mg/dL (ref 0.3–1.2)
Total Protein: 7.2 g/dL (ref 6.5–8.1)

## 2021-07-06 LAB — URINALYSIS, ROUTINE W REFLEX MICROSCOPIC
Bilirubin Urine: NEGATIVE
Glucose, UA: NEGATIVE mg/dL
Hgb urine dipstick: NEGATIVE
Ketones, ur: NEGATIVE mg/dL
Leukocytes,Ua: NEGATIVE
Nitrite: NEGATIVE
Protein, ur: NEGATIVE mg/dL
Specific Gravity, Urine: >1.03 — ABNORMAL HIGH (ref 1.005–1.030)
pH: 6 (ref 5.0–8.0)

## 2021-07-06 LAB — LIPASE, BLOOD: Lipase: 31 U/L (ref 11–51)
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
## 2021-07-06 NOTE — ED Notes (Signed)
Pt states he is leaving and will follow up with his PMD.  Pt states he feels better.  4/10 pain scale.  Pt advised and agreed to returning if sx worsen or change in any way

## 2021-07-06 NOTE — ED Provider Notes (Signed)
  Emergency Medicine Provider Triage Evaluation Note  [name] [name] , a 65 y.o.male,  was evaluated in triage.  Pt complains of RUQ/Lower right chest pain.  Patient states that the pain started yesterday morning and radiates into his throat around to his back.  Patient endorses a history of gallbladder issues in the past and states that this is what it feels like.  Denies shortness of breath.  NAD.   Review of Systems  Positive: Right upper quadrant pain Negative: Denies fever, chest pain, vomiting  Physical Exam   Vitals:   07/06/21 1629  BP: 132/85  Pulse: 71  Resp: 16  Temp: 98.4 F (36.9 C)  SpO2: 96%   Gen:   Awake, no distress   Resp:  Normal effort  MSK:   Moves extremities without difficulty  Other:    Medical Decision Making  Given the patient's initial medical screening exam, the following diagnostic evaluation has been ordered. The patient will be placed in the appropriate treatment space, once one is available, to complete the evaluation and treatment. I have discussed the plan of care with the patient and I have advised the patient that an ED physician or mid-level practitioner will reevaluate their condition after the test results have been received, as the results may give them additional insight into the type of treatment they may need.    Diagnostics: Right upper quadrant ultrasound, UA, lipase, labs.  Treatments: none immediately   Teodoro Spray, PA 07/06/21 1287    Nance Pear, MD 07/06/21 [phone]
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
## 2021-07-09 ENCOUNTER — Ambulatory Visit (INDEPENDENT_AMBULATORY_CARE_PROVIDER_SITE_OTHER): Payer: BC Managed Care – PPO | Admitting: Family Medicine

## 2021-07-09 ENCOUNTER — Encounter: Payer: Self-pay | Admitting: Family Medicine

## 2021-07-09 VITALS — BP 136/80 | HR 77 | Temp 97.7°F | Resp 16 | Ht 70.0 in | Wt 249.2 lb

## 2021-07-09 DIAGNOSIS — R1011 Right upper quadrant pain: Secondary | ICD-10-CM

## 2021-07-09 DIAGNOSIS — M549 Dorsalgia, unspecified: Secondary | ICD-10-CM | POA: Diagnosis not present

## 2021-07-09 DIAGNOSIS — Z6835 Body mass index (BMI) 35.0-35.9, adult: Secondary | ICD-10-CM

## 2021-07-09 DIAGNOSIS — K219 Gastro-esophageal reflux disease without esophagitis: Secondary | ICD-10-CM | POA: Diagnosis not present

## 2021-07-09 DIAGNOSIS — Z1211 Encounter for screening for malignant neoplasm of colon: Secondary | ICD-10-CM

## 2021-07-09 DIAGNOSIS — K824 Cholesterolosis of gallbladder: Secondary | ICD-10-CM

## 2021-07-09 DIAGNOSIS — R1310 Dysphagia, unspecified: Secondary | ICD-10-CM | POA: Diagnosis not present

## 2021-07-09 HISTORY — DX: Cholesterolosis of gallbladder: K82.4

## 2021-07-09 MED ORDER — OMEPRAZOLE 40 MG PO CPDR
40.0000 mg | DELAYED_RELEASE_CAPSULE | Freq: Every day | ORAL | 1 refills | Status: DC
Start: 2021-07-09 — End: 2021-10-31

## 2021-07-09 NOTE — Assessment & Plan Note (Signed)
We discussed RUQ [place] result. Recommendation was to follow with imaging annually, he is really concerned about this problem, surgical evaluation offered but he prefers to hold on this. Low-fat diet and weight loss will be beneficial.

## 2021-07-09 NOTE — Assessment & Plan Note (Signed)
This problem could explain some of his symptoms. Recommend taking omeprazole 40 mg daily. GERD precautions also discussed. GI referral placed.

## 2021-07-09 NOTE — Assessment & Plan Note (Signed)
We discussed benefits of wt loss as well as adverse effects of obesity. Consistency with healthy diet and physical activity recommended.  

## 2021-07-09 NOTE — Patient Instructions (Addendum)
A few things to remember from today's visit:  Gastroesophageal reflux disease, unspecified whether esophagitis present - Plan: Ambulatory referral to Gastroenterology  Dysphagia, unspecified type - Plan: Ambulatory referral to Gastroenterology  Upper back pain  If you need refills please call your pharmacy. Do not use My Chart to request refills or for acute issues that need immediate attention.  Avoid foods that make your symptoms worse, for example coffee, chocolate,pepermeint,alcohol, and greasy food. Raising the head of your bed about 6 inches may help with nocturnal symptoms.  Weight loss. Avoid lying down for 3 hours after eating.  Instead 3 large meals daily try small and more frequent meals during the day.  You should be evaluated immediately if bloody vomiting, bloody stools, black stools (like tar), difficulty swallowing, food gets stuck on the way down or choking when eating. Abnormal weight loss or severe abdominal pain.  If symptoms are not resolved sometimes endoscopy is necessary. . Please be sure medication list is accurate. If a new problem present, please set up appointment sooner than planned today.  Upper back pain can also be muscular.

## 2021-08-18 DIAGNOSIS — K824 Cholesterolosis of gallbladder: Secondary | ICD-10-CM | POA: Diagnosis not present

## 2021-08-18 DIAGNOSIS — E7800 Pure hypercholesterolemia, unspecified: Secondary | ICD-10-CM | POA: Diagnosis not present

## 2021-08-18 DIAGNOSIS — K219 Gastro-esophageal reflux disease without esophagitis: Secondary | ICD-10-CM | POA: Diagnosis not present

## 2021-08-18 DIAGNOSIS — R7303 Prediabetes: Secondary | ICD-10-CM | POA: Diagnosis not present

## 2021-08-22 DIAGNOSIS — M109 Gout, unspecified: Secondary | ICD-10-CM | POA: Diagnosis not present

## 2021-08-22 DIAGNOSIS — R7303 Prediabetes: Secondary | ICD-10-CM | POA: Diagnosis not present

## 2021-08-22 DIAGNOSIS — Z6835 Body mass index (BMI) 35.0-35.9, adult: Secondary | ICD-10-CM | POA: Diagnosis not present

## 2021-08-22 DIAGNOSIS — Z79899 Other long term (current) drug therapy: Secondary | ICD-10-CM | POA: Diagnosis not present

## 2021-08-22 DIAGNOSIS — Z125 Encounter for screening for malignant neoplasm of prostate: Secondary | ICD-10-CM | POA: Diagnosis not present

## 2021-08-22 DIAGNOSIS — E7800 Pure hypercholesterolemia, unspecified: Secondary | ICD-10-CM | POA: Diagnosis not present

## 2021-09-06 ENCOUNTER — Other Ambulatory Visit: Payer: Self-pay | Admitting: Family Medicine

## 2021-09-07 ENCOUNTER — Other Ambulatory Visit: Payer: Self-pay | Admitting: Family Medicine

## 2021-09-18 ENCOUNTER — Ambulatory Visit: Payer: BC Managed Care – PPO | Admitting: Orthopedic Surgery

## 2021-09-24 ENCOUNTER — Encounter: Payer: Self-pay | Admitting: Family Medicine
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
## 2021-10-03 ENCOUNTER — Encounter: Payer: Self-pay | Admitting: Physician Assistant

## 2021-10-03 ENCOUNTER — Ambulatory Visit (INDEPENDENT_AMBULATORY_CARE_PROVIDER_SITE_OTHER): Payer: BC Managed Care – PPO | Admitting: Physician Assistant

## 2021-10-03 VITALS — BP 110/70 | HR 80 | Ht 70.0 in | Wt 241.0 lb

## 2021-10-03 DIAGNOSIS — Z6835 Body mass index (BMI) 35.0-35.9, adult: Secondary | ICD-10-CM

## 2021-10-03 DIAGNOSIS — Z1211 Encounter for screening for malignant neoplasm of colon: Secondary | ICD-10-CM | POA: Diagnosis not present

## 2021-10-03 DIAGNOSIS — K219 Gastro-esophageal reflux disease without esophagitis: Secondary | ICD-10-CM

## 2021-10-03 DIAGNOSIS — K824 Cholesterolosis of gallbladder: Secondary | ICD-10-CM

## 2021-10-03 NOTE — Progress Notes (Signed)
Reviewed.  Emma Schupp L. Exa Bomba, MD, MPH  

## 2021-10-03 NOTE — Patient Instructions (Addendum)
You have been scheduled for an EGD and Colonoscopy. Please follow the written instructions given to you at your visit today. If you use inhalers (even only as needed), please bring them with you on the day of your procedure.  Please take your proton pump inhibitor medication 30 minutes to 1 hour before meals- this makes it more effective.  Avoid spicy and acidic foods Avoid fatty foods Limit your intake of coffee, tea, alcohol, and carbonated drinks Work to maintain a healthy weight Keep the head of the bed elevated at least 3 inches with blocks or a wedge pillow if you are having any nighttime symptoms Stay upright for 2 hours after eating Avoid meals and snacks three to four hours before bedtime  Silent reflux: Not all heartburn burns...Marland KitchenMarland KitchenMarland Kitchen  What is LPR? Laryngopharyngeal reflux (LPR) or silent reflux is a condition in which acid that is made in the stomach travels up the esophagus (swallowing tube) and gets to the throat. Not everyone with reflux has a lot of heartburn or indigestion. In fact, many people with LPR never have heartburn. This is why LPR is called SILENT REFLUX, and the terms "Silent reflux" and "LPR" are often used interchangeably. Because LPR is silent, it is sometimes difficult to diagnose.  How can you tell if you have LPR?  Chronic hoarseness- Some people have hoarseness that comes and goes throat clearing  Cough It can cause shortness of breath and cause asthma like symptoms. a feeling of a lump in the throat  difficulty swallowing a problem with too much nose and throat drainage.  Some people will feel their esophagus spasm which feels like their heart beating hard and fast, this will usually be after a meal, at rest, or lying down at night.    How do I treat this? Treatment for LPR should be individualized, and your doctor will suggest the best treatment for you. Generally there are several treatments for LPR: changing habits and diet to reduce reflux,   medications to reduce stomach acid, and  surgery to prevent reflux. Most people with LPR need to modify how and when they eat, as well as take some medication, to get well. Sometimes, nonprescription liquid antacids, such as Maalox, Gelucil and Mylanta are recommended. When used, these antacids should be taken four times each day - one tablespoon one hour after each meal and before bedtime. Dietary and lifestyle changes alone are not often enough to control LPR - medications that reduce stomach acid are also usually needed. These must be prescribed by our doctor.   TIPS FOR REDUCING REFLUX AND LPR Control your LIFE-STYLE and your DIET! If you use tobacco, QUIT.  Smoking makes you reflux. After every cigarette you have some LPR.  Don't wear clothing that is too tight, especially around the waist (trousers, corsets, belts).  Do not lie down just after eating...in fact, do not eat within three hours of bedtime.  You should be on a low-fat diet.  Limit your intake of red meat.  Limit your intake of butter.  Avoid fried foods.  Avoid chocolate  Avoid cheese.  Avoid eggs. Specifically avoid caffeine (especially coffee and tea), soda pop (especially cola) and mints.  Avoid alcoholic beverages, particularly in the evening.  BMI:  If you are age 57 or older, your body mass index should be between 23-30. Your Body mass index is 34.58 kg/m. If this is out of the aforementioned range listed, please consider follow up with your Primary Care Provider.  If you  are age 13 or younger, your body mass index should be between 19-25. Your Body mass index is 34.58 kg/m. If this is out of the aformentioned range listed, please consider follow up with your Primary Care Provider.   MY CHART:  The [facility] providers would like to encourage you to use [facility] to communicate with providers for non-urgent requests or questions.  Due to long hold times on the telephone, sending your provider a message by  [facility] may be a faster and more efficient way to get a response.  Please allow 48 business hours for a response.  Please remember that this is for non-urgent requests.   Thank you for trusting me with your gastrointestinal care!

## 2021-10-24 ENCOUNTER — Encounter: Payer: Self-pay | Admitting: Gastroenterology

## 2021-10-24 DIAGNOSIS — M79641 Pain in right hand: Secondary | ICD-10-CM | POA: Diagnosis not present

## 2021-10-26 ENCOUNTER — Encounter: Payer: Self-pay | Admitting: Certified Registered Nurse Anesthetist

## 2021-10-31 ENCOUNTER — Encounter: Payer: Self-pay | Admitting: Gastroenterology

## 2021-10-31 ENCOUNTER — Ambulatory Visit (AMBULATORY_SURGERY_CENTER): Payer: BC Managed Care – PPO | Admitting: Gastroenterology

## 2021-10-31 VITALS — BP 136/78 | HR 70 | Temp 98.0°F | Resp 16 | Ht 70.0 in | Wt 241.0 lb

## 2021-10-31 DIAGNOSIS — D122 Benign neoplasm of ascending colon: Secondary | ICD-10-CM

## 2021-10-31 DIAGNOSIS — K317 Polyp of stomach and duodenum: Secondary | ICD-10-CM

## 2021-10-31 DIAGNOSIS — K219 Gastro-esophageal reflux disease without esophagitis: Secondary | ICD-10-CM

## 2021-10-31 DIAGNOSIS — Z1211 Encounter for screening for malignant neoplasm of colon: Secondary | ICD-10-CM | POA: Diagnosis not present

## 2021-10-31 DIAGNOSIS — K2970 Gastritis, unspecified, without bleeding: Secondary | ICD-10-CM

## 2021-10-31 MED ORDER — OMEPRAZOLE 40 MG PO CPDR: 40.0000 mg | DELAYED_RELEASE_CAPSULE | Freq: Two times a day (BID) | ORAL | 3 refills | Status: DC

## 2021-10-31 MED ORDER — SODIUM CHLORIDE 0.9 % IV SOLN: 500.0000 mL | Freq: Once | INTRAVENOUS | Status: DC
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
## 2021-10-31 NOTE — Progress Notes (Signed)
Report given to PACU, vss 

## 2021-10-31 NOTE — Progress Notes (Signed)
VS by CW. ?

## 2021-10-31 NOTE — Progress Notes (Signed)
Called to room to assist during endoscopic procedure.  Patient ID and intended procedure confirmed with present staff. Received instructions for my participation in the procedure from the performing physician.  

## 2021-10-31 NOTE — Progress Notes (Signed)
1454 Robinul 0.1 mg IV given due large amount of secretions upon assessment.  MD made aware, vss 

## 2021-10-31 NOTE — Op Note (Addendum)
[place] ?Patient Name: [name] [name] ?Procedure Date: 10/31/2021 2:32 PM ?MRN: [id number] ?Endoscopist: Thornton Park MD, MD ?Age: 66 ?Referring MD:  ?Date of Birth: [birth year] ?Gender: Male ?Account #: [id number] ?Procedure:                Colonoscopy ?Indications:              Screening for colorectal malignant neoplasm, This  ?                          is the patient's first colonoscopy ?Medicines:                Monitored Anesthesia Care ?Procedure:                Pre-Anesthesia Assessment: ?                          - Prior to the procedure, a History and Physical  ?                          was performed, and patient medications and  ?                          allergies were reviewed. The patient's tolerance of  ?                          previous anesthesia was also reviewed. The risks  ?                          and benefits of the procedure and the sedation  ?                          options and risks were discussed with the patient.  ?                          All questions were answered, and informed consent  ?                          was obtained. Prior Anticoagulants: The patient has  ?                          taken no previous anticoagulant or antiplatelet  ?                          agents. ASA Grade Assessment: II - A patient with  ?                          mild systemic disease. After reviewing the risks  ?                          and benefits, the patient was deemed in  ?                          satisfactory condition to undergo the procedure. ?  After obtaining informed consent, the colonoscope  ?                          was passed under direct vision. Throughout the  ?                          procedure, the patient's blood pressure, pulse, and  ?                          oxygen saturations were monitored continuously. The  ?                          Olympus CF-[id number] (#[id number]) Colonoscope was  ?                          introduced through the anus  and advanced to the 3  ?                          cm into the ileum. A second forward view of the  ?                          right colon was performed. The colonoscopy was  ?                          performed without difficulty. The patient tolerated  ?                          the procedure well. The quality of the bowel  ?                          preparation was good after lavage of some residual  ?                          liquid to semi-formed stool throughout the colon.  ?                          The terminal ileum, ileocecal valve, appendiceal  ?                          orifice, and rectum were photographed. ?Scope In: 3:14:13 PM ?Scope Out: 3:27:27 PM ?Scope Withdrawal Time: 0 hours 9 minutes 42 seconds  ?Total Procedure Duration: 0 hours 13 minutes 14 seconds  ?Findings:                 Non-bleeding external and internal hemorrhoids were  ?                          found. ?                          A few small and large-mouthed diverticula were  ?                          found in the sigmoid colon and ascending colon. ?  Two sessile polyps were found in the ascending  ?                          colon. The polyps were 2 to 3 mm in size. These  ?                          polyps were removed with a cold snare. Resection  ?                          and retrieval were complete. Estimated blood loss  ?                          was minimal. ?                          The exam was otherwise without abnormality on  ?                          direct and retroflexion views. ?Complications:            No immediate complications. ?Estimated Blood Loss:     Estimated blood loss was minimal. ?Impression:               - Non-bleeding external and internal hemorrhoids. ?                          - Diverticulosis in the sigmoid colon and ascending  ?                          colon. ?                          - Two 2 to 3 mm polyps in the ascending colon,  ?                          removed with a  cold snare. Resected and retrieved. ?                          - The examination was otherwise normal on direct  ?                          and retroflexion views. ?Recommendation:           - Patient has a contact number available for  ?                          emergencies. The signs and symptoms of potential  ?                          delayed complications were discussed with the  ?                          patient. Return to normal activities tomorrow.  ?  Written discharge instructions were provided to the  ?                          patient. ?                          - High fiber diet. ?                          - Continue present medications. ?                          - Await pathology results. ?                          - Repeat colonoscopy date to be determined after  ?                          pending pathology results are reviewed for  ?                          surveillance. Use a 2 day bowel prep at that time. ?                          - Emerging evidence supports eating a diet of  ?                          fruits, vegetables, grains, calcium, and yogurt  ?                          while reducing red meat and alcohol may reduce the  ?                          risk of colon cancer. ?                          - Thank you for allowing me to be involved in your  ?                          colon cancer prevention. ?Thornton Park MD, MD ?10/31/2021 3:36:31 PM ?This report has been signed electronically. ?

## 2021-10-31 NOTE — Patient Instructions (Signed)
Thank you for coming in to see [place] today. ?Resume previous diet and medications today. ?Increase your Omeprazole to 40 mg by mouth twice daily (before meals). ?Avoid all NSAIDS including Aspirin, Ibuprofen, Aleve. ?Return to normal daily activities tomorrow. ? ? ?YOU HAD AN ENDOSCOPIC PROCEDURE TODAY AT [facility]:   Refer to the procedure report that was given to you for any specific questions about what was found during the examination.  If the procedure report does not answer your questions, please call your gastroenterologist to clarify.  If you requested that your care partner not be given the details of your procedure findings, then the procedure report has been included in a sealed envelope for you to review at your convenience later. ? ?YOU SHOULD EXPECT: Some feelings of bloating in the abdomen. Passage of more gas than usual.  Walking can help get rid of the air that was put into your GI tract during the procedure and reduce the bloating. If you had a lower endoscopy (such as a colonoscopy or flexible sigmoidoscopy) you may notice spotting of blood in your stool or on the toilet paper. If you underwent a bowel prep for your procedure, you may not have a normal bowel movement for a few days. ? ?Please Note:  You might notice some irritation and congestion in your nose or some drainage.  This is from the oxygen used during your procedure.  There is no need for concern and it should clear up in a day or so. ? ?SYMPTOMS TO REPORT IMMEDIATELY: ? ?Following lower endoscopy (colonoscopy or flexible sigmoidoscopy): ? Excessive amounts of blood in the stool ? Significant tenderness or worsening of abdominal pains ? Swelling of the abdomen that is new, acute ? Fever of 100?F or higher ? ?Following upper endoscopy (EGD) ? Vomiting of blood or coffee ground material ? New chest pain or pain under the shoulder blades ? Painful or persistently difficult swallowing ? New shortness of breath ? Fever of  100?F or higher ? Black, tarry-looking stools ? ?For urgent or emergent issues, a gastroenterologist can be reached at any hour by calling [phone]. ?Do not use MyChart messaging for urgent concerns.  ? ? ?DIET:  We do recommend a small meal at first, but then you may proceed to your regular diet.  Drink plenty of fluids but you should avoid alcoholic beverages for 24 hours. ? ?ACTIVITY:  You should plan to take it easy for the rest of today and you should NOT DRIVE or use heavy machinery until tomorrow (because of the sedation medicines used during the test).   ? ?FOLLOW UP: ?Our staff will call the number listed on your records 48-72 hours following your procedure to check on you and address any questions or concerns that you may have regarding the information given to you following your procedure. If we do not reach you, we will leave a message.  We will attempt to reach you two times.  During this call, we will ask if you have developed any symptoms of COVID 19. If you develop any symptoms (ie: fever, flu-like symptoms, shortness of breath, cough etc.) before then, please call [phone].  If you test positive for Covid 19 in the 2 weeks post procedure, please call and report this information to [place].   ? ?If any biopsies were taken you will be contacted by phone or by letter within the next 1-3 weeks.  Please call us at [phone] if you have not heard  about the biopsies in 3 weeks.  ? ? ?SIGNATURES/CONFIDENTIALITY: ?You and/or your care partner have signed paperwork which will be entered into your electronic medical record.  These signatures attest to the fact that that the information above on your After Visit Summary has been reviewed and is understood.  Full responsibility of the confidentiality of this discharge information lies with you and/or your care-partner.  ?

## 2021-10-31 NOTE — Op Note (Addendum)
[place] ?Patient Name: [name] [name] ?Procedure Date: 10/31/2021 2:40 PM ?MRN: [id number] ?Endoscopist: Thornton Park MD, MD ?Age: 66 ?Referring MD:  ?Date of Birth: [birth year] ?Gender: Male ?Account #: [id number] ?Procedure:                Upper GI endoscopy ?Indications:              Dysphagia, Suspected esophageal reflux ?Medicines:                Monitored Anesthesia Care ?Procedure:                Pre-Anesthesia Assessment: ?                          - Prior to the procedure, a History and Physical  ?                          was performed, and patient medications and  ?                          allergies were reviewed. The patient's tolerance of  ?                          previous anesthesia was also reviewed. The risks  ?                          and benefits of the procedure and the sedation  ?                          options and risks were discussed with the patient.  ?                          All questions were answered, and informed consent  ?                          was obtained. Prior Anticoagulants: The patient has  ?                          taken no previous anticoagulant or antiplatelet  ?                          agents. ASA Grade Assessment: II - A patient with  ?                          mild systemic disease. After reviewing the risks  ?                          and benefits, the patient was deemed in  ?                          satisfactory condition to undergo the procedure. ?                          After obtaining informed consent, the endoscope was  ?  passed under direct vision. Throughout the  ?                          procedure, the patient's blood pressure, pulse, and  ?                          oxygen saturations were monitored continuously. The  ?                          GIF [id number] #[id number] was introduced through the  ?                          mouth, and advanced to the third part of duodenum.  ?                          The upper GI  endoscopy was accomplished without  ?                          difficulty. The patient tolerated the procedure  ?                          well. ?Scope In: ?Scope Out: ?Findings:                 The examined esophagus was normal. The z-line is 36  ?                          cm from the incisors. Biopsies were obtained from  ?                          the mid/proximal and distal esophagus with cold  ?                          forceps for histology of suspected eosinophilic  ?                          esophagitis. ?                          Patchy mildly erythematous mucosa without bleeding  ?                          was found in the gastric body. Biopsies were taken  ?                          from the antrum, body, and fundus with a cold  ?                          forceps for histology. Estimated blood loss was  ?                          minimal. ?                          Multiple small sessile polyps were found in the  ?  gastric body. Biopsies were taken with a cold  ?                          forceps for histology. Estimated blood loss was  ?                          minimal. ?                          The examined duodenum was normal. ?Complications:            No immediate complications. ?Estimated Blood Loss:     Estimated blood loss was minimal. ?Impression:               - Normal esophagus. Biopsied. ?                          - Erythematous mucosa in the gastric body. Biopsied. ?                          - Multiple gastric polyps. Biopsied. ?                          - Normal examined duodenum. ?Recommendation:           - Patient has a contact number available for  ?                          emergencies. The signs and symptoms of potential  ?                          delayed complications were discussed with the  ?                          patient. Return to normal activities tomorrow.  ?                          Written discharge instructions were provided to the  ?                           patient. ?                          - Resume previous diet. ?                          - Continue present medications. ?                          - Increase omeprazole to 40 mg twice daily. ?                          - Await pathology results. ?                          - Avoid all non-steroidal antiinflammatory  ?  medications. ?Thornton Park MD, MD ?10/31/2021 3:31:01 PM ?This report has been signed electronically. ?

## 2021-11-04 ENCOUNTER — Telehealth: Payer: Self-pay

## 2021-11-04 NOTE — Telephone Encounter (Signed)
Left message on follow up call. 

## 2021-11-04 NOTE — Telephone Encounter (Signed)
Left message on answering machine. 

## 2021-11-18 DIAGNOSIS — G4733 Obstructive sleep apnea (adult) (pediatric): Secondary | ICD-10-CM | POA: Diagnosis not present

## 2021-12-08 ENCOUNTER — Ambulatory Visit (INDEPENDENT_AMBULATORY_CARE_PROVIDER_SITE_OTHER): Payer: BC Managed Care – PPO | Admitting: Physician Assistant

## 2021-12-08 ENCOUNTER — Encounter: Payer: Self-pay | Admitting: Physician Assistant

## 2021-12-08 VITALS — BP 116/72 | HR 80 | Ht 70.0 in | Wt 250.7 lb

## 2021-12-08 DIAGNOSIS — K219 Gastro-esophageal reflux disease without esophagitis: Secondary | ICD-10-CM | POA: Diagnosis not present

## 2021-12-08 DIAGNOSIS — Z86010 Personal history of colonic polyps: Secondary | ICD-10-CM | POA: Diagnosis not present

## 2021-12-08 DIAGNOSIS — R1314 Dysphagia, pharyngoesophageal phase: Secondary | ICD-10-CM
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
## 2021-12-08 NOTE — Patient Instructions (Signed)
Continue Omeprazole '40mg'$  -Take 1 capsule by mouth twice.  ? ?You have been scheduled for a Barium Esophogram at [facility] (1st floor of the hospital) on 12/15/21 at 11:00am. Please arrive 30 minutes prior to your appointment for registration. Make certain not to have anything to eat or drink 3 hours prior to your test. If you need to reschedule for any reason, please contact radiology at [phone] to do so. ?__________________________________________________________________ ?A barium swallow is an examination that concentrates on views of the esophagus. This tends to be a double contrast exam (barium and two liquids which, when combined, create a gas to distend the wall of the oesophagus) or single contrast (non-ionic iodine based). The study is usually tailored to your symptoms so a good history is essential. Attention is paid during the study to the form, structure and configuration of the esophagus, looking for functional disorders (such as aspiration, dysphagia, achalasia, motility and reflux) ?EXAMINATION ?You may be asked to change into a gown, depending on the type of swallow being performed. A radiologist and radiographer will perform the procedure. The radiologist will advise you of the ?type of contrast selected for your procedure and direct you during the exam. You will be asked to stand, sit or lie in several different positions and to hold a small amount of fluid in your mouth before being asked to swallow while the imaging is performed .In some instances you may be asked to swallow barium coated marshmallows to assess the motility of a solid food bolus. ?The exam can be recorded as a digital or video fluoroscopy procedure. ?POST PROCEDURE ?It will take 1-2 days for the barium to pass through your system. To facilitate this, it is important, unless otherwise directed, to increase your fluids for the next 24-48hrs and to resume your normal diet.  ?This test typically takes about 30 minutes  to perform. ?_____________________________________________________________________________ ? ?Thank you for choosing me and [facility]. ? ?[name] PA  ? ?

## 2021-12-09 NOTE — Progress Notes (Signed)
Reviewed and agree with management plans. ? ?Katherene Dinino L. Meko Bellanger, MD, MPH  ?

## 2021-12-15 ENCOUNTER — Ambulatory Visit (HOSPITAL_COMMUNITY)
Admission: RE | Admit: 2021-12-15 | Discharge: 2021-12-15 | Disposition: A | Discharge status: Home / Self Care | Payer: BC Managed Care – PPO | Source: Ambulatory Visit | Attending: Physician Assistant | Admitting: Physician Assistant

## 2021-12-15 DIAGNOSIS — R1314 Dysphagia, pharyngoesophageal phase: Secondary | ICD-10-CM | POA: Insufficient documentation

## 2021-12-15 DIAGNOSIS — R1310 Dysphagia, unspecified: Secondary | ICD-10-CM | POA: Diagnosis not present

## 2021-12-15 DIAGNOSIS — K219 Gastro-esophageal reflux disease without esophagitis: Secondary | ICD-10-CM | POA: Insufficient documentation

## 2021-12-25 DIAGNOSIS — M13862 Other specified arthritis, left knee: Secondary | ICD-10-CM | POA: Diagnosis not present

## 2022-01-11 ENCOUNTER — Other Ambulatory Visit: Payer: Self-pay | Admitting: Family Medicine

## 2022-01-11 DIAGNOSIS — K219 Gastro-esophageal reflux disease without esophagitis: Secondary | ICD-10-CM

## 2022-02-20 DIAGNOSIS — K824 Cholesterolosis of gallbladder: Secondary | ICD-10-CM | POA: Diagnosis not present

## 2022-02-20 DIAGNOSIS — R7303 Prediabetes: Secondary | ICD-10-CM | POA: Diagnosis not present

## 2022-02-20 DIAGNOSIS — K219 Gastro-esophageal reflux disease without esophagitis: Secondary | ICD-10-CM | POA: Diagnosis not present

## 2022-02-20 DIAGNOSIS — M109 Gout, unspecified: Secondary | ICD-10-CM | POA: Diagnosis not present

## 2022-02-20 DIAGNOSIS — E7800 Pure hypercholesterolemia, unspecified: Secondary | ICD-10-CM | POA: Diagnosis not present

## 2022-03-31 ENCOUNTER — Other Ambulatory Visit: Payer: Self-pay | Admitting: Family Medicine

## 2022-04-10 IMAGING — DX DG CHEST 2V
2 series · 2 of 2 positions shown · non-contrast
Comparison: 12/09/2020 and earlier.

CLINICAL DATA: 65-year-old male with shortness of breath. Former
smoker.

EXAM:
CHEST - 2 VIEW

[chest pa]
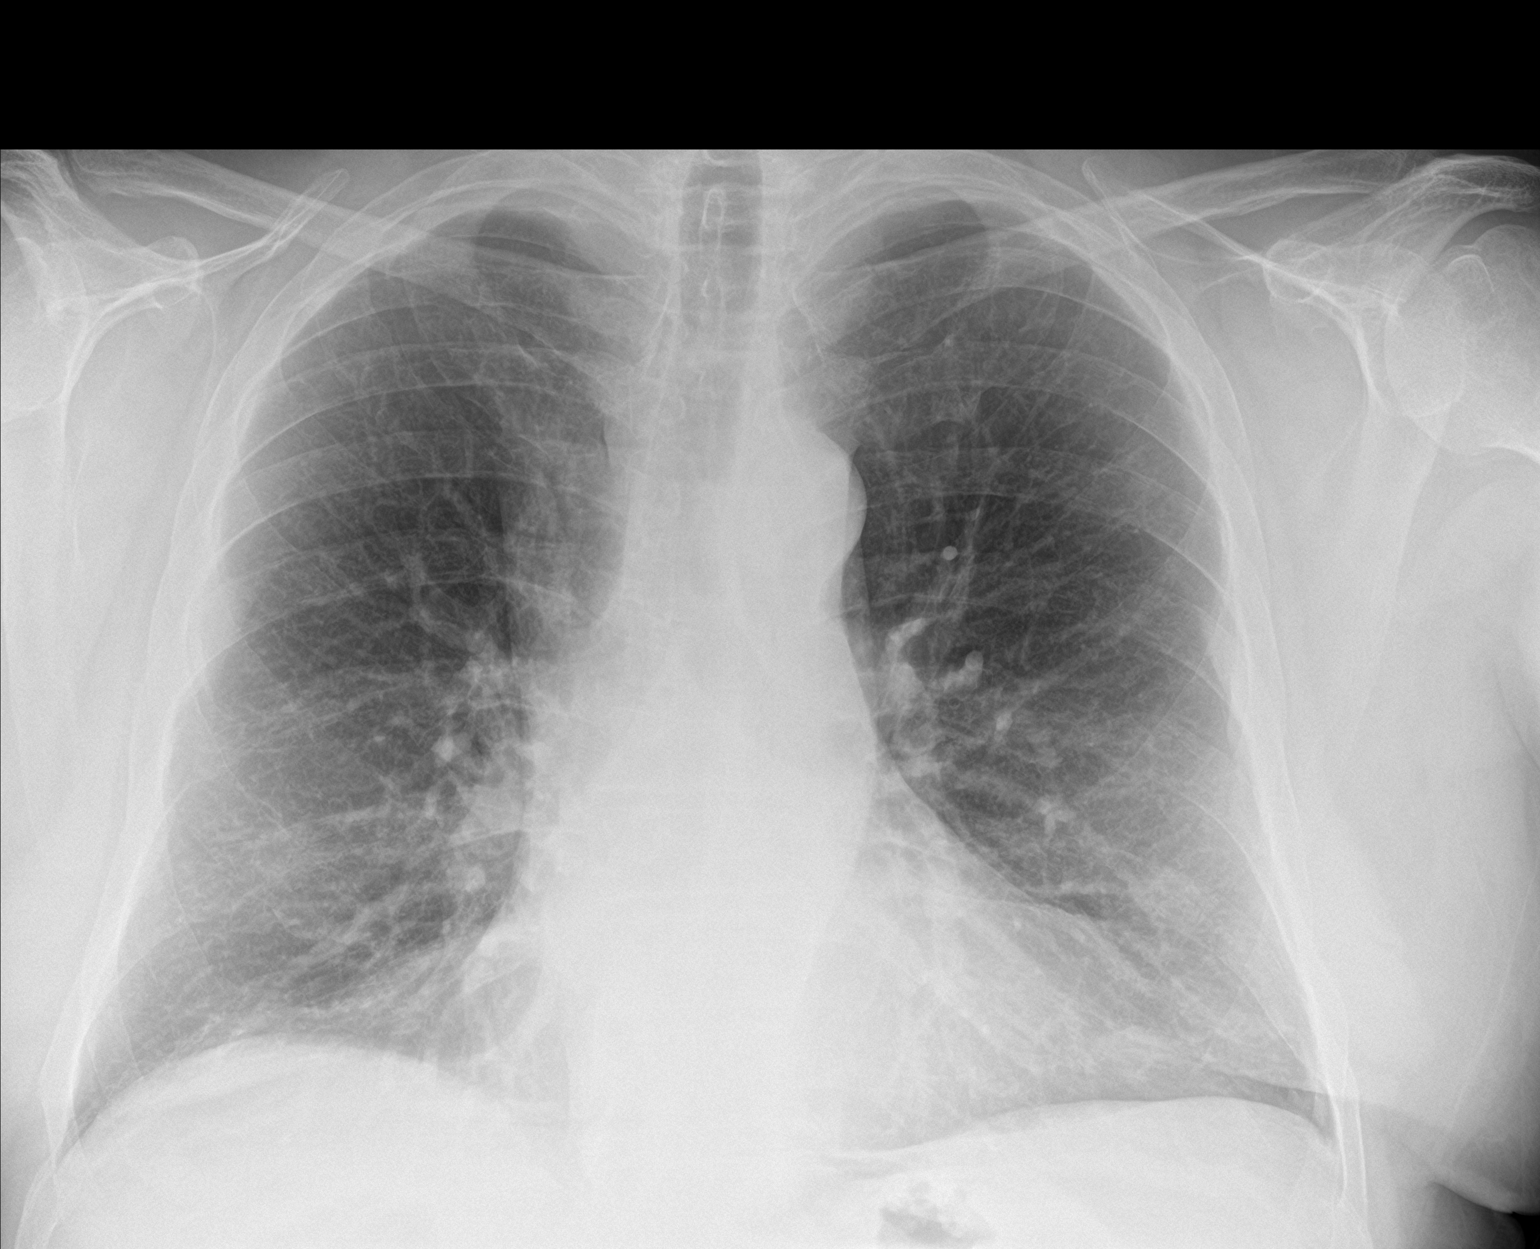

[chest lat]
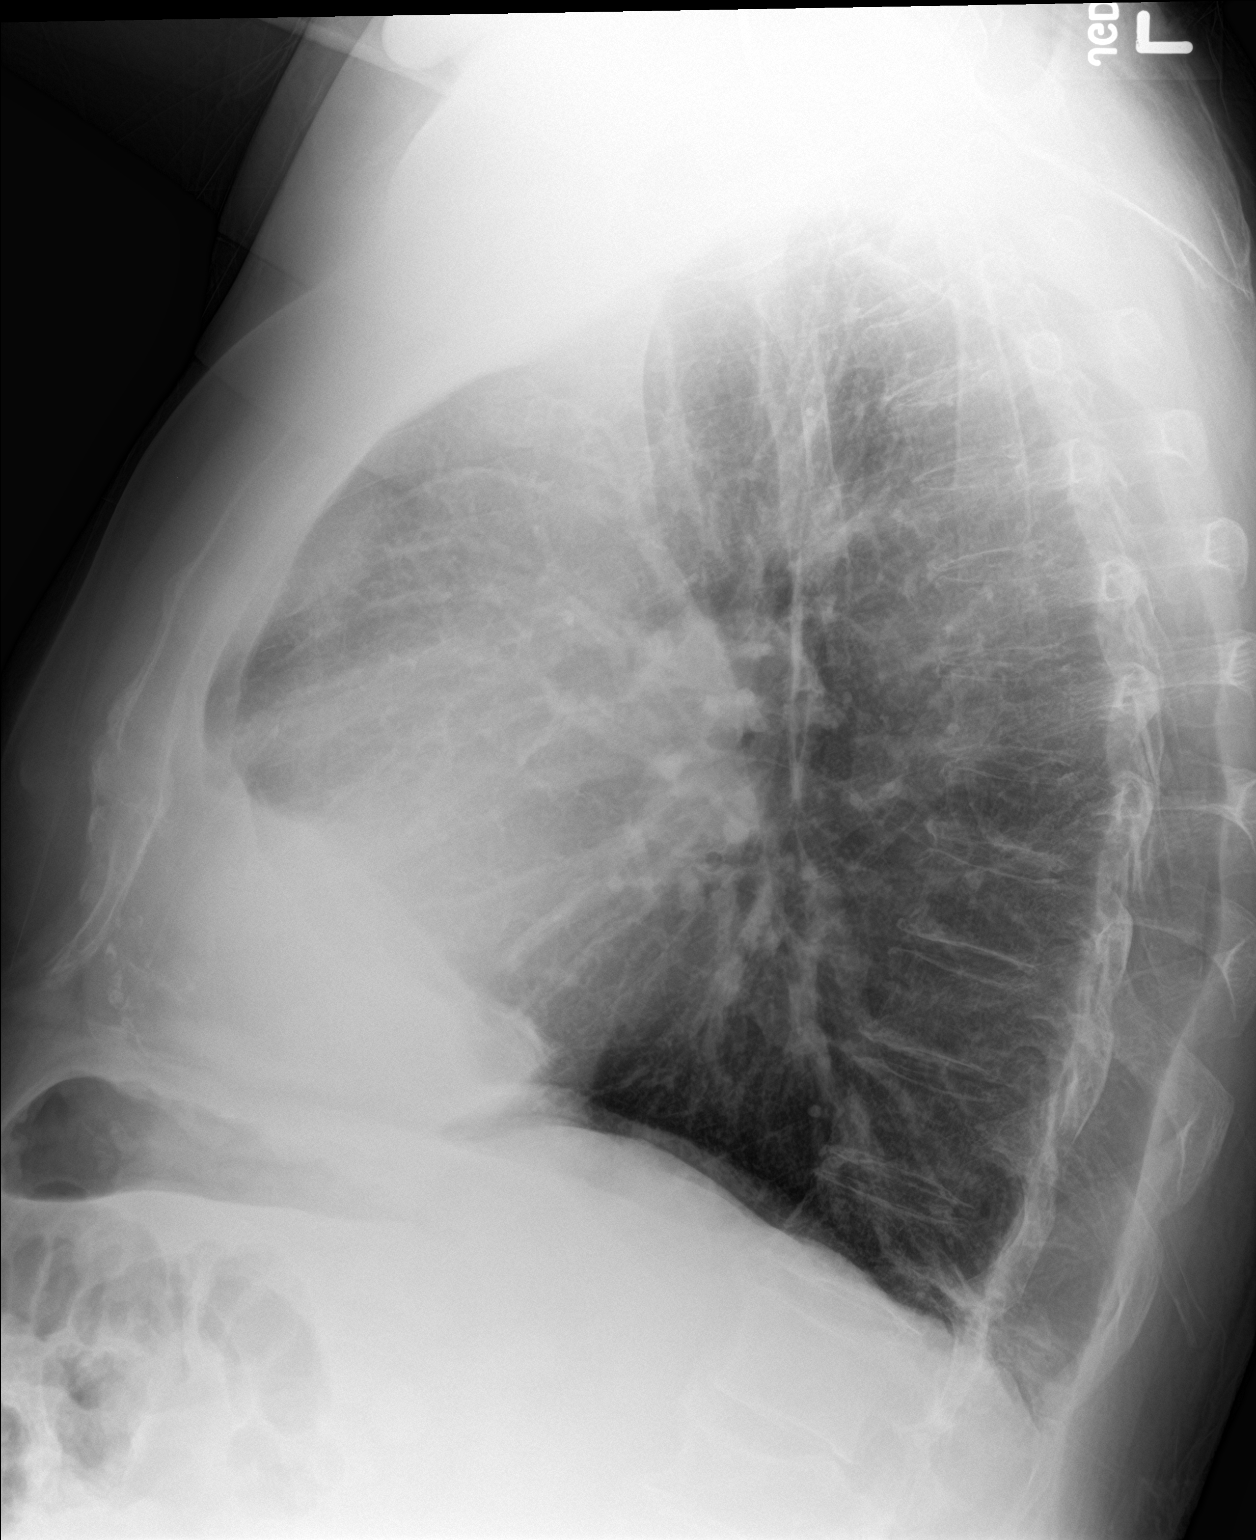

[2 of 2 positions shown; findings below may reference images not displayed]

FINDINGS: Stable lung volumes and mediastinal contours since 6756. Chronic
hyperinflation and mild cardiomegaly. Visualized tracheal air column
is within normal limits. No pneumothorax, pulmonary edema, pleural
effusion or acute pulmonary opacity. No acute osseous abnormality
identified. Prior cervical ACDF. Negative visible bowel gas pattern.
IMPRESSION: Chronic pulmonary hyperinflation. No acute cardiopulmonary
abnormality.

## 2022-04-14 DIAGNOSIS — M79642 Pain in left hand: Secondary | ICD-10-CM | POA: Diagnosis not present

## 2022-04-14 DIAGNOSIS — S63642A Sprain of metacarpophalangeal joint of left thumb, initial encounter: Secondary | ICD-10-CM | POA: Diagnosis not present

## 2022-05-08 DIAGNOSIS — R5382 Chronic fatigue, unspecified: Secondary | ICD-10-CM | POA: Diagnosis not present

## 2022-05-08 DIAGNOSIS — R41840 Attention and concentration deficit: Secondary | ICD-10-CM | POA: Diagnosis not present

## 2022-05-15 DIAGNOSIS — R7989 Other specified abnormal findings of blood chemistry: Secondary | ICD-10-CM | POA: Diagnosis not present

## 2022-05-15 DIAGNOSIS — Z131 Encounter for screening for diabetes mellitus: Secondary | ICD-10-CM | POA: Diagnosis not present

## 2022-05-15 DIAGNOSIS — R7303 Prediabetes: Secondary | ICD-10-CM | POA: Diagnosis not present

## 2022-05-15 DIAGNOSIS — Z13220 Encounter for screening for lipoid disorders: Secondary | ICD-10-CM | POA: Diagnosis not present

## 2022-05-22 DIAGNOSIS — Z01812 Encounter for preprocedural laboratory examination: Secondary | ICD-10-CM | POA: Diagnosis not present

## 2022-05-22 DIAGNOSIS — R7989 Other specified abnormal findings of blood chemistry: Secondary | ICD-10-CM | POA: Diagnosis not present

## 2022-05-22 DIAGNOSIS — R5382 Chronic fatigue, unspecified: Secondary | ICD-10-CM | POA: Diagnosis not present

## 2022-06-11 DIAGNOSIS — M13862 Other specified arthritis, left knee: Secondary | ICD-10-CM | POA: Diagnosis not present

## 2022-06-15 DIAGNOSIS — M1712 Unilateral primary osteoarthritis, left knee: Secondary | ICD-10-CM | POA: Diagnosis not present

## 2022-06-17 DIAGNOSIS — Z01818 Encounter for other preprocedural examination: Secondary | ICD-10-CM | POA: Diagnosis not present

## 2022-06-17 DIAGNOSIS — Z6835 Body mass index (BMI) 35.0-35.9, adult: Secondary | ICD-10-CM | POA: Diagnosis not present

## 2022-06-17 DIAGNOSIS — M1712 Unilateral primary osteoarthritis, left knee: Secondary | ICD-10-CM | POA: Diagnosis not present

## 2022-07-28 DIAGNOSIS — J069 Acute upper respiratory infection, unspecified: Secondary | ICD-10-CM | POA: Diagnosis not present

## 2022-08-11 DIAGNOSIS — G4733 Obstructive sleep apnea (adult) (pediatric): Secondary | ICD-10-CM | POA: Diagnosis not present

## 2022-08-13 ENCOUNTER — Ambulatory Visit: Payer: Self-pay | Admitting: Student

## 2022-08-13 NOTE — Progress Notes (Signed)
Sent message, via epic in basket, requesting orders in epic from surgeon.  

## 2022-08-14 NOTE — Patient Instructions (Signed)
DUE TO COVID-19 ONLY TWO VISITORS  (aged 66 and older)  ARE ALLOWED TO COME WITH YOU AND STAY IN THE WAITING ROOM ONLY DURING PRE OP AND PROCEDURE.   **NO VISITORS ARE ALLOWED IN THE SHORT STAY AREA OR RECOVERY ROOM!!**  IF YOU WILL BE ADMITTED INTO THE HOSPITAL YOU ARE ALLOWED ONLY FOUR SUPPORT PEOPLE DURING VISITATION HOURS ONLY (7 AM -8PM)   The support person(s) must pass our screening, gel in and out, and wear a mask at all times, including in the patient's room. Patients must also wear a mask when staff or their support person are in the room. Visitors GUEST BADGE MUST BE WORN VISIBLY  One adult visitor may remain with you overnight and MUST be in the room by 8 P.M.     Your procedure is scheduled on: 09/02/22   Report to [facility] Main Entrance    Report to admitting at  5:15 AM   Call this number if you have problems the morning of surgery [phone]   Do not eat food :After Midnight.   After Midnight you may have the following liquids until 4:30______ AM/  DAY OF SURGERY  Water Black Coffee (sugar ok, NO MILK/CREAM OR CREAMERS)  Tea (sugar ok, NO MILK/CREAM OR CREAMERS) regular and decaf                             Plain Jell-O (NO RED)                                           Fruit ices (not with fruit pulp, NO RED)                                     Popsicles (NO RED)                                                                  Juice: apple, WHITE grape, WHITE cranberry Sports drinks like Gatorade (NO RED)                   The day of surgery:  Drink ONE (1) Pre-Surgery Clear Ensure at 4:15 AM the morning of surgery. Drink in one sitting. Do not sip.  This drink was given to you during your hospital  pre-op appointment visit. Nothing else to drink after completing the  Pre-Surgery Clear Ensure at 4:30 AM          If you have questions, please contact your surgeon's office.      Oral Hygiene is also important to reduce your risk of infection.                                     Remember - BRUSH YOUR TEETH THE MORNING OF SURGERY WITH YOUR REGULAR TOOTHPASTE  DENTURES WILL BE REMOVED PRIOR TO SURGERY PLEASE DO NOT APPLY "Poly grip" OR ADHESIVES!!!   Do NOT smoke after Midnight   Take  these medicines the morning of surgery with A SIP OF WATER: Bupropion-Wellbutrin                                                                                                                           Escitalopram-Lexapro                                                                                                                           Allopurinol                                                                                                                           Omeprazole  Bring CPAP mask and tubing day of surgery.                              You may not have any metal on your body including  jewelry, and body piercing             Do not wear  lotions, powders, perfumes/cologne, or deodorant                Men may shave face and neck.   Do not bring valuables to the hospital. [place].   Contacts, glasses, or bridgework may not be worn into surgery.   DO NOT [place].     Patients discharged on the day of surgery will not be allowed to drive home.  Someone NEEDS to stay with you for the first 24 hours after anesthesia.   Special Instructions: Bring a copy of your healthcare power of attorney and living will documents the day of surgery if you haven't scanned them before.  Please read over the following fact sheets you were given: IF YOU HAVE QUESTIONS ABOUT YOUR PRE-OP INSTRUCTIONS PLEASE CALL [phone]    [facility] - Preparing for Surgery Before surgery, you can play an important role.  Because skin is not sterile, your skin needs to be as free of germs as possible.  You can reduce the number of germs on your skin by washing  with CHG (chlorahexidine gluconate) soap before surgery.  CHG is an antiseptic cleaner which kills germs and bonds with the skin to continue killing germs even after washing. Please DO NOT use if you have an allergy to CHG or antibacterial soaps.  If your skin becomes reddened/irritated stop using the CHG and inform your nurse when you arrive at Short Stay. You may shave your face/neck. Please follow these instructions carefully:  1.  Shower with CHG Soap the night before surgery and the  morning of Surgery.  2.  If you choose to wash your hair, wash your hair first as usual with your  normal  shampoo.  3.  After you shampoo, rinse your hair and body thoroughly to remove the  shampoo.                            4.  Use CHG as you would any other liquid soap.  You can apply chg directly  to the skin and wash                       Gently with a scrungie or clean washcloth.  5.  Apply the CHG Soap to your body ONLY FROM THE NECK DOWN.   Do not use on face/ open                           Wound or open sores. Avoid contact with eyes, ears mouth and genitals (private parts).                       Wash face,  Genitals (private parts) with your normal soap.             6.  Wash thoroughly, paying special attention to the area where your surgery  will be performed.  7.  Thoroughly rinse your body with warm water from the neck down.  8.  DO NOT shower/wash with your normal soap after using and rinsing off  the CHG Soap.                9.  Pat yourself dry with a clean towel.            10.  Wear clean pajamas.            11.  Place clean sheets on your bed the night of your first shower and do not  sleep with pets. Day of Surgery : Do not apply any lotions/deodorants the morning of surgery.  Please wear clean clothes to the hospital/surgery center.  FAILURE TO FOLLOW THESE INSTRUCTIONS MAY RESULT IN THE CANCELLATION OF YOUR  SURGERY   ________________________________________________________________________  Incentive Spirometer  An incentive spirometer is a tool that can help keep your lungs clear and active. This tool measures how well you are filling your lungs with each breath. Taking long deep breaths may help reverse or decrease the chance of developing breathing (pulmonary) problems (especially infection) following: A long period of  time when you are unable to move or be active. BEFORE THE PROCEDURE  If the spirometer includes an indicator to show your best effort, your nurse or respiratory therapist will set it to a desired goal. If possible, sit up straight or lean slightly forward. Try not to slouch. Hold the incentive spirometer in an upright position. INSTRUCTIONS FOR USE  Sit on the edge of your bed if possible, or sit up as far as you can in bed or on a chair. Hold the incentive spirometer in an upright position. Breathe out normally. Place the mouthpiece in your mouth and seal your lips tightly around it. Breathe in slowly and as deeply as possible, raising the piston or the ball toward the top of the column. Hold your breath for 3-5 seconds or for as long as possible. Allow the piston or ball to fall to the bottom of the column. Remove the mouthpiece from your mouth and breathe out normally. Rest for a few seconds and repeat Steps 1 through 7 at least 10 times every 1-2 hours when you are awake. Take your time and take a few normal breaths between deep breaths. The spirometer may include an indicator to show your best effort. Use the indicator as a goal to work toward during each repetition. After each set of 10 deep breaths, practice coughing to be sure your lungs are clear. If you have an incision (the cut made at the time of surgery), support your incision when coughing by placing a pillow or rolled up towels firmly against it. Once you are able to get out of bed, walk around indoors and cough  well. You may stop using the incentive spirometer when instructed by your caregiver.  RISKS AND COMPLICATIONS Take your time so you do not get dizzy or light-headed. If you are in pain, you may need to take or ask for pain medication before doing incentive spirometry. It is harder to take a deep breath if you are having pain. AFTER USE Rest and breathe slowly and easily. It can be helpful to keep track of a log of your progress. Your caregiver can provide you with a simple table to help with this. If you are using the spirometer at home, follow these instructions: [place] IF:  You are having difficultly using the spirometer. You have trouble using the spirometer as often as instructed. Your pain medication is not giving enough relief while using the spirometer. You develop fever of 100.5 F (38.1 C) or higher. SEEK IMMEDIATE MEDICAL CARE IF:  You cough up bloody sputum that had not been present before. You develop fever of 102 F (38.9 C) or greater. You develop worsening pain at or near the incision site. MAKE SURE YOU:  Understand these instructions. Will watch your condition. Will get help right away if you are not doing well or get worse. Document Released: 12/07/2006 Document Revised: 10/19/2011 Document Reviewed: 02/07/2007 [facility] Patient Information 2014 [place], [place].   ________________________________________________________________________

## 2022-08-18 ENCOUNTER — Ambulatory Visit: Payer: Self-pay | Admitting: Student

## 2022-08-20 ENCOUNTER — Encounter (HOSPITAL_COMMUNITY)
Admission: RE | Admit: 2022-08-20 | Discharge: 2022-08-20 | Disposition: A | Discharge status: Home / Self Care | Payer: BC Managed Care – PPO | Source: Ambulatory Visit | Attending: Family Medicine | Admitting: Family Medicine

## 2022-08-24 NOTE — Progress Notes (Signed)
COVID Vaccine Completed:  Date of COVID positive in last 90 days:  PCP - Betty [name], MD Cardiologist - Candee Furbish, MD (LOV 10/18/18)  Chest x-ray -  EKG -  Stress Test - 10/21/18 Epic ECHO - 10/10/18 Epic Cardiac Cath -  Pacemaker/ICD device last checked: Spinal Cord Stimulator:  Bowel Prep -   Sleep Study -  CPAP -   Fasting Blood Sugar -  Checks Blood Sugar _____ times a day  Last dose of GLP1 agonist-  N/A GLP1 instructions:  N/A   Last dose of SGLT-2 inhibitors-  N/A SGLT-2 instructions: N/A   Blood Thinner Instructions: Aspirin Instructions: Last Dose:  Activity level:  Can go up a flight of stairs and perform activities of daily living without stopping and without symptoms of chest pain or shortness of breath.  Able to exercise without symptoms  Unable to go up a flight of stairs without symptoms of     Anesthesia review:   Patient denies shortness of breath, fever, cough and chest pain at PAT appointment  Patient verbalized understanding of instructions that were given to them at the PAT appointment. Patient was also instructed that they will need to review over the PAT instructions again at home before surgery.

## 2022-08-24 NOTE — Patient Instructions (Signed)
SURGICAL WAITING ROOM VISITATION  Patients having surgery or a procedure may have no more than 2 support people in the waiting area - these visitors may rotate.    Children under the age of 36 must have an adult with them who is not the patient.  Due to an increase in RSV and influenza rates and associated hospitalizations, children ages 102 and under may not visit patients in [place].  If the patient needs to stay at the hospital during part of their recovery, the visitor guidelines for inpatient rooms apply. Pre-op nurse will coordinate an appropriate time for 1 support person to accompany patient in pre-op.  This support person may not rotate.    Please refer to the [facility] website for the visitor guidelines for Inpatients (after your surgery is over and you are in a regular room).    Your procedure is scheduled on: 09/02/22   Report to [facility] Main Entrance    Report to admitting at 5:15 AM   Call this number if you have problems the morning of surgery [phone]   Do not eat food :After Midnight.   After Midnight you may have the following liquids until 4:30 AM DAY OF SURGERY  Water Non-Citrus Juices (without pulp, NO RED-Apple, White grape, White cranberry) Black Coffee (NO MILK/CREAM OR CREAMERS, sugar ok)  Clear Tea (NO MILK/CREAM OR CREAMERS, sugar ok) regular and decaf                             Plain Jell-O (NO RED)                                           Fruit ices (not with fruit pulp, NO RED)                                     Popsicles (NO RED)                                                               Sports drinks like Gatorade (NO RED)                 The day of surgery:  Drink ONE (1) Pre-Surgery Clear Ensure at 4:30 AM the morning of surgery. Drink in one sitting. Do not sip.  This drink was given to you during your hospital  pre-op appointment visit. Nothing else to drink after completing the  Pre-Surgery Clear  Ensure.          If you have questions, please contact your surgeon's office.   FOLLOW BOWEL PREP AND ANY ADDITIONAL PRE OP INSTRUCTIONS YOU RECEIVED FROM YOUR SURGEON'S OFFICE!!!     Oral Hygiene is also important to reduce your risk of infection.                                    Remember - BRUSH YOUR TEETH THE MORNING OF SURGERY WITH YOUR REGULAR TOOTHPASTE  DENTURES WILL  BE REMOVED PRIOR TO SURGERY PLEASE DO NOT APPLY "Poly grip" OR ADHESIVES!!!   Take these medicines the morning of surgery with A SIP OF WATER: Bupropion, Escitalopram, Allopurinol, Omeprazole                               You may not have any metal on your body including jewelry, and body piercing             Do not wear lotions, powders, cologne, or deodorant              Men may shave face and neck.   Do not bring valuables to the hospital. [place].   Contacts, glasses, dentures or bridgework may not be worn into surgery.  DO NOT [place]. PHARMACY WILL DISPENSE MEDICATIONS LISTED ON YOUR MEDICATION LIST TO YOU DURING YOUR ADMISSION [place]!    Patients discharged on the day of surgery will not be allowed to drive home.  Someone NEEDS to stay with you for the first 24 hours after anesthesia.   Special Instructions: Bring a copy of your healthcare power of attorney and living will documents the day of surgery if you haven't scanned them before.              Please read over the following fact sheets you were given: IF [name]    If you received a COVID test during your pre-op visit  it is requested that you wear a mask when out in public, stay away from anyone that may not be feeling well and notify your surgeon if you develop symptoms. If you test positive for Covid or have been in contact with anyone that has tested positive in the last 10  days please notify you surgeon.    [facility] - Preparing for Surgery Before surgery, you can play an important role.  Because skin is not sterile, your skin needs to be as free of germs as possible.  You can reduce the number of germs on your skin by washing with CHG (chlorahexidine gluconate) soap before surgery.  CHG is an antiseptic cleaner which kills germs and bonds with the skin to continue killing germs even after washing. Please DO NOT use if you have an allergy to CHG or antibacterial soaps.  If your skin becomes reddened/irritated stop using the CHG and inform your nurse when you arrive at Short Stay. Do not shave (including legs and underarms) for at least 48 hours prior to the first CHG shower.  You may shave your face/neck.  Please follow these instructions carefully:  1.  Shower with CHG Soap the night before surgery and the  morning of surgery.  2.  If you choose to wash your hair, wash your hair first as usual with your normal  shampoo.  3.  After you shampoo, rinse your hair and body thoroughly to remove the shampoo.                             4.  Use CHG as you would any other liquid soap.  You can apply chg directly to the skin and wash.  Gently with a scrungie or clean washcloth.  5.  Apply the CHG Soap to your body ONLY FROM THE NECK DOWN.   Do   not use on face/ open                           Wound or open sores. Avoid contact with eyes, ears mouth and   genitals (private parts).                       Wash face,  Genitals (private parts) with your normal soap.             6.  Wash thoroughly, paying special attention to the area where your    surgery  will be performed.  7.  Thoroughly rinse your body with warm water from the neck down.  8.  DO NOT shower/wash with your normal soap after using and rinsing off the CHG Soap.                9.  Pat yourself dry with a clean towel.            10.  Wear clean pajamas.            11.  Place clean sheets on your bed the night of  your first shower and do not  sleep with pets. Day of Surgery : Do not apply any lotions/deodorants the morning of surgery.  Please wear clean clothes to the hospital/surgery center.  FAILURE TO FOLLOW THESE INSTRUCTIONS MAY RESULT IN THE CANCELLATION OF YOUR SURGERY  PATIENT SIGNATURE_________________________________  NURSE SIGNATURE__________________________________  ________________________________________________________________________

## 2022-08-25 ENCOUNTER — Encounter (HOSPITAL_COMMUNITY)
Admission: RE | Admit: 2022-08-25 | Discharge: 2022-08-25 | Disposition: A | Discharge status: Home / Self Care | Payer: BC Managed Care – PPO | Source: Ambulatory Visit | Attending: Orthopedic Surgery | Admitting: Orthopedic Surgery

## 2022-08-25 ENCOUNTER — Encounter (HOSPITAL_COMMUNITY): Payer: Self-pay

## 2022-08-25 DIAGNOSIS — Z01818 Encounter for other preprocedural examination: Secondary | ICD-10-CM | POA: Diagnosis not present

## 2022-08-25 HISTORY — DX: Fatty (change of) liver, not elsewhere classified: K76.0

## 2022-08-25 LAB — BASIC METABOLIC PANEL
Anion gap: 8 (ref 5–15)
BUN: 15 mg/dL (ref 8–23)
CO2: 24 mmol/L (ref 22–32)
Calcium: 9.2 mg/dL (ref 8.9–10.3)
Chloride: 102 mmol/L (ref 98–111)
Creatinine, Ser: 0.82 mg/dL (ref 0.61–1.24)
GFR, Estimated: >60 mL/min (ref >60)
Glucose, Bld: 141 mg/dL — ABNORMAL HIGH (ref 70–99)
Potassium: 4 mmol/L (ref 3.5–5.1)
Sodium: 134 mmol/L — ABNORMAL LOW (ref 135–145)

## 2022-08-25 LAB — CBC
HCT: 41.9 % (ref 39.0–52.0)
Hemoglobin: 14.2 g/dL (ref 13.0–17.0)
MCH: 30.7 pg (ref 26.0–34.0)
MCHC: 33.9 g/dL (ref 30.0–36.0)
MCV: 90.5 fL (ref 80.0–100.0)
Platelets: 245 10*3/uL (ref 150–400)
RBC: 4.63 MIL/uL (ref 4.22–5.81)
RDW: 12.2 % (ref 11.5–15.5)
WBC: 8.4 10*3/uL (ref 4.0–10.5)
nRBC: 0 % (ref 0.0–0.2)

## 2022-08-25 LAB — SURGICAL PCR SCREEN
MRSA, PCR: NEGATIVE
Staphylococcus aureus: POSITIVE — AB

## 2022-08-25 NOTE — Progress Notes (Signed)
[facility] results routed to Dr. Lyla Glassing

## 2022-08-26 ENCOUNTER — Ambulatory Visit: Payer: Self-pay | Admitting: Student
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
## 2022-08-28 DIAGNOSIS — K824 Cholesterolosis of gallbladder: Secondary | ICD-10-CM | POA: Diagnosis not present

## 2022-08-28 DIAGNOSIS — M109 Gout, unspecified: Secondary | ICD-10-CM | POA: Diagnosis not present

## 2022-08-28 DIAGNOSIS — R7989 Other specified abnormal findings of blood chemistry: Secondary | ICD-10-CM | POA: Diagnosis not present

## 2022-08-28 DIAGNOSIS — R7303 Prediabetes: Secondary | ICD-10-CM | POA: Diagnosis not present

## 2022-08-28 DIAGNOSIS — K219 Gastro-esophageal reflux disease without esophagitis: Secondary | ICD-10-CM | POA: Diagnosis not present

## 2022-08-28 DIAGNOSIS — Z125 Encounter for screening for malignant neoplasm of prostate: Secondary | ICD-10-CM | POA: Diagnosis not present

## 2022-08-28 DIAGNOSIS — Z79899 Other long term (current) drug therapy: Secondary | ICD-10-CM | POA: Diagnosis not present

## 2022-08-28 DIAGNOSIS — E7800 Pure hypercholesterolemia, unspecified: Secondary | ICD-10-CM | POA: Diagnosis not present

## 2022-08-31 ENCOUNTER — Other Ambulatory Visit: Payer: Self-pay | Admitting: Family Medicine

## 2022-08-31 DIAGNOSIS — K824 Cholesterolosis of gallbladder: Secondary | ICD-10-CM

## 2022-09-01 ENCOUNTER — Encounter (HOSPITAL_COMMUNITY): Payer: Self-pay | Admitting: Orthopedic Surgery

## 2022-09-02 ENCOUNTER — Ambulatory Visit (HOSPITAL_COMMUNITY)
Admission: RE | Admit: 2022-09-02 | Discharge: 2022-09-02 | Disposition: A | Discharge status: Home / Self Care | Payer: BC Managed Care – PPO | Source: Ambulatory Visit | Attending: Orthopedic Surgery | Admitting: Orthopedic Surgery

## 2022-09-02 ENCOUNTER — Ambulatory Visit (HOSPITAL_COMMUNITY): Payer: BC Managed Care – PPO | Admitting: Anesthesiology

## 2022-09-02 ENCOUNTER — Encounter (HOSPITAL_COMMUNITY): Payer: Self-pay | Admitting: Orthopedic Surgery

## 2022-09-02 ENCOUNTER — Other Ambulatory Visit: Payer: Self-pay

## 2022-09-02 ENCOUNTER — Ambulatory Visit (HOSPITAL_COMMUNITY): Payer: BC Managed Care – PPO

## 2022-09-02 ENCOUNTER — Encounter (HOSPITAL_COMMUNITY)
Admission: RE | Disposition: A | Discharge status: Home / Self Care | Payer: Self-pay | Source: Ambulatory Visit | Attending: Orthopedic Surgery

## 2022-09-02 DIAGNOSIS — G4733 Obstructive sleep apnea (adult) (pediatric): Secondary | ICD-10-CM | POA: Insufficient documentation

## 2022-09-02 DIAGNOSIS — F419 Anxiety disorder, unspecified: Secondary | ICD-10-CM | POA: Diagnosis not present

## 2022-09-02 DIAGNOSIS — E782 Mixed hyperlipidemia: Secondary | ICD-10-CM | POA: Insufficient documentation

## 2022-09-02 DIAGNOSIS — F32A Depression, unspecified: Secondary | ICD-10-CM | POA: Insufficient documentation

## 2022-09-02 DIAGNOSIS — Z87891 Personal history of nicotine dependence: Secondary | ICD-10-CM | POA: Diagnosis not present

## 2022-09-02 DIAGNOSIS — K760 Fatty (change of) liver, not elsewhere classified: Secondary | ICD-10-CM | POA: Diagnosis not present

## 2022-09-02 DIAGNOSIS — Z981 Arthrodesis status: Secondary | ICD-10-CM | POA: Diagnosis not present

## 2022-09-02 DIAGNOSIS — M1712 Unilateral primary osteoarthritis, left knee: Secondary | ICD-10-CM

## 2022-09-02 DIAGNOSIS — Z471 Aftercare following joint replacement surgery: Secondary | ICD-10-CM | POA: Diagnosis not present

## 2022-09-02 DIAGNOSIS — Z01818 Encounter for other preprocedural examination: Secondary | ICD-10-CM

## 2022-09-02 DIAGNOSIS — Z79890 Hormone replacement therapy (postmenopausal): Secondary | ICD-10-CM | POA: Diagnosis not present

## 2022-09-02 DIAGNOSIS — Z6835 Body mass index (BMI) 35.0-35.9, adult: Secondary | ICD-10-CM | POA: Insufficient documentation

## 2022-09-02 DIAGNOSIS — K219 Gastro-esophageal reflux disease without esophagitis: Secondary | ICD-10-CM | POA: Diagnosis not present

## 2022-09-02 DIAGNOSIS — G8918 Other acute postprocedural pain: Secondary | ICD-10-CM | POA: Diagnosis not present

## 2022-09-02 DIAGNOSIS — Z79899 Other long term (current) drug therapy: Secondary | ICD-10-CM | POA: Diagnosis not present

## 2022-09-02 DIAGNOSIS — Z96652 Presence of left artificial knee joint: Secondary | ICD-10-CM | POA: Diagnosis not present

## 2022-09-02 HISTORY — DX: Unilateral primary osteoarthritis, left knee: M17.12

## 2022-09-02 HISTORY — PX: KNEE ARTHROPLASTY: SHX992

## 2022-09-02 LAB — COMPREHENSIVE METABOLIC PANEL
ALT: 32 U/L (ref 0–44)
AST: 28 U/L (ref 15–41)
Albumin: 3.8 g/dL (ref 3.5–5.0)
Alkaline Phosphatase: 59 U/L (ref 38–126)
Anion gap: 9 (ref 5–15)
BUN: 14 mg/dL (ref 8–23)
CO2: 25 mmol/L (ref 22–32)
Calcium: 9 mg/dL (ref 8.9–10.3)
Chloride: 102 mmol/L (ref 98–111)
Creatinine, Ser: 0.89 mg/dL (ref 0.61–1.24)
GFR, Estimated: >60 mL/min (ref >60)
Glucose, Bld: 252 mg/dL — ABNORMAL HIGH (ref 70–99)
Potassium: 3.5 mmol/L (ref 3.5–5.1)
Sodium: 136 mmol/L (ref 135–145)
Total Bilirubin: 0.9 mg/dL (ref 0.3–1.2)
Total Protein: 7.1 g/dL (ref 6.5–8.1)

## 2022-09-02 SURGERY — ARTHROPLASTY, KNEE, TOTAL, USING IMAGELESS COMPUTER-ASSISTED NAVIGATION
Anesthesia: Regional | Site: Knee | Laterality: Left

## 2022-09-02 MED ORDER — KETOROLAC TROMETHAMINE 30 MG/ML IJ SOLN
INTRAMUSCULAR | Status: AC
  Filled 2022-09-02: qty 1

## 2022-09-02 MED ORDER — LACTATED RINGERS IV SOLN: INTRAVENOUS | Status: DC

## 2022-09-02 MED ORDER — HYDROMORPHONE HCL 1 MG/ML IJ SOLN: 0.2500 mg | INTRAMUSCULAR | Status: DC | PRN

## 2022-09-02 MED ORDER — CEFAZOLIN SODIUM-DEXTROSE 2-4 GM/100ML-% IV SOLN
2.0000 g | Freq: Four times a day (QID) | INTRAVENOUS | Status: DC
  Administered 2022-09-02: 2 g via INTRAVENOUS

## 2022-09-02 MED ORDER — CHLORHEXIDINE GLUCONATE 0.12 % MT SOLN
15.0000 mL | Freq: Once | OROMUCOSAL | Status: AC
  Administered 2022-09-02: 15 mL via OROMUCOSAL

## 2022-09-02 MED ORDER — CEFAZOLIN SODIUM-DEXTROSE 2-4 GM/100ML-% IV SOLN
INTRAVENOUS | Status: AC
  Filled 2022-09-02: qty 100

## 2022-09-02 MED ORDER — METOCLOPRAMIDE HCL 5 MG/ML IJ SOLN: 5.0000 mg | Freq: Three times a day (TID) | INTRAMUSCULAR | Status: DC | PRN

## 2022-09-02 MED ORDER — METOCLOPRAMIDE HCL 5 MG PO TABS: 5.0000 mg | ORAL_TABLET | Freq: Three times a day (TID) | ORAL | Status: DC | PRN

## 2022-09-02 MED ORDER — POLYETHYLENE GLYCOL 3350 17 G PO PACK: 17.0000 g | PACK | Freq: Every day | ORAL | 0 refills | Status: DC | PRN

## 2022-09-02 MED ORDER — SODIUM CHLORIDE (PF) 0.9 % IJ SOLN
INTRAMUSCULAR | Status: DC | PRN
  Administered 2022-09-02: 30 mL

## 2022-09-02 MED ORDER — TRANEXAMIC ACID-NACL 1000-0.7 MG/100ML-% IV SOLN
1000.0000 mg | INTRAVENOUS | Status: AC
  Administered 2022-09-02: 1000 mg via INTRAVENOUS
  Filled 2022-09-02: qty 100

## 2022-09-02 MED ORDER — ROPIVACAINE HCL 5 MG/ML IJ SOLN
INTRAMUSCULAR | Status: DC | PRN
  Administered 2022-09-02: 20 mL via PERINEURAL

## 2022-09-02 MED ORDER — PROPOFOL 1000 MG/100ML IV EMUL
INTRAVENOUS | Status: AC
  Filled 2022-09-02: qty 100

## 2022-09-02 MED ORDER — ISOPROPYL ALCOHOL 70 % SOLN
Status: AC
  Filled 2022-09-02: qty 480

## 2022-09-02 MED ORDER — SODIUM CHLORIDE 0.9 % IR SOLN
Status: DC | PRN
  Administered 2022-09-02: 3000 mL

## 2022-09-02 MED ORDER — METHOCARBAMOL 500 MG IVPB - SIMPLE MED
500.0000 mg | Freq: Four times a day (QID) | INTRAVENOUS | Status: DC | PRN
  Administered 2022-09-02: 500 mg via INTRAVENOUS

## 2022-09-02 MED ORDER — OXYCODONE HCL 5 MG PO TABS: 5.0000 mg | ORAL_TABLET | ORAL | 0 refills | Status: AC | PRN

## 2022-09-02 MED ORDER — ORAL CARE MOUTH RINSE: 15.0000 mL | Freq: Once | OROMUCOSAL | Status: AC

## 2022-09-02 MED ORDER — PHENYLEPHRINE HCL-NACL 20-0.9 MG/250ML-% IV SOLN
INTRAVENOUS | Status: DC | PRN
  Administered 2022-09-02: 55 ug/min via INTRAVENOUS

## 2022-09-02 MED ORDER — 0.9 % SODIUM CHLORIDE (POUR BTL) OPTIME
TOPICAL | Status: DC | PRN
  Administered 2022-09-02: 1000 mL

## 2022-09-02 MED ORDER — OXYCODONE HCL 5 MG PO TABS
ORAL_TABLET | ORAL | Status: AC
  Filled 2022-09-02: qty 2

## 2022-09-02 MED ORDER — SODIUM CHLORIDE (PF) 0.9 % IJ SOLN
INTRAMUSCULAR | Status: AC
  Filled 2022-09-02: qty 50

## 2022-09-02 MED ORDER — MELOXICAM 15 MG PO TABS: 15.0000 mg | ORAL_TABLET | Freq: Every day | ORAL | 3 refills | Status: DC

## 2022-09-02 MED ORDER — KETOROLAC TROMETHAMINE 30 MG/ML IJ SOLN
INTRAMUSCULAR | Status: DC | PRN
  Administered 2022-09-02: 30 mg

## 2022-09-02 MED ORDER — LIDOCAINE 2% (20 MG/ML) 5 ML SYRINGE
INTRAMUSCULAR | Status: DC | PRN
  Administered 2022-09-02: 50 mg via INTRAVENOUS

## 2022-09-02 MED ORDER — BUPIVACAINE IN DEXTROSE 0.75-8.25 % IT SOLN
INTRATHECAL | Status: DC | PRN
  Administered 2022-09-02: 2 mL via INTRATHECAL

## 2022-09-02 MED ORDER — LACTATED RINGERS IV BOLUS
250.0000 mL | Freq: Once | INTRAVENOUS | Status: AC
  Administered 2022-09-02: 250 mL via INTRAVENOUS

## 2022-09-02 MED ORDER — BUPIVACAINE HCL (PF) 0.25 % IJ SOLN
INTRAMUSCULAR | Status: DC | PRN
  Administered 2022-09-02: 30 mL

## 2022-09-02 MED ORDER — ONDANSETRON HCL 4 MG/2ML IJ SOLN
INTRAMUSCULAR | Status: DC | PRN
  Administered 2022-09-02: 4 mg via INTRAVENOUS

## 2022-09-02 MED ORDER — POVIDONE-IODINE 10 % EX SWAB: 2.0000 | Freq: Once | CUTANEOUS | Status: DC

## 2022-09-02 MED ORDER — ONDANSETRON HCL 4 MG PO TABS: 4.0000 mg | ORAL_TABLET | Freq: Four times a day (QID) | ORAL | Status: DC | PRN

## 2022-09-02 MED ORDER — PHENYLEPHRINE 80 MCG/ML (10ML) SYRINGE FOR IV PUSH (FOR BLOOD PRESSURE SUPPORT)
PREFILLED_SYRINGE | INTRAVENOUS | Status: DC | PRN
  Administered 2022-09-02 (×5): 80 ug via INTRAVENOUS

## 2022-09-02 MED ORDER — FENTANYL CITRATE (PF) 100 MCG/2ML IJ SOLN
INTRAMUSCULAR | Status: AC
  Filled 2022-09-02: qty 2

## 2022-09-02 MED ORDER — SODIUM CHLORIDE 0.9 % IR SOLN
Status: DC | PRN
  Administered 2022-09-02: 1000 mL

## 2022-09-02 MED ORDER — PROPOFOL 10 MG/ML IV BOLUS
INTRAVENOUS | Status: AC
  Filled 2022-09-02: qty 20

## 2022-09-02 MED ORDER — MIDAZOLAM HCL 2 MG/2ML IJ SOLN
INTRAMUSCULAR | Status: AC
  Filled 2022-09-02: qty 2

## 2022-09-02 MED ORDER — SENNA 8.6 MG PO TABS: 2.0000 | ORAL_TABLET | Freq: Every day | ORAL | 1 refills | Status: AC

## 2022-09-02 MED ORDER — CEFAZOLIN SODIUM-DEXTROSE 2-4 GM/100ML-% IV SOLN
2.0000 g | INTRAVENOUS | Status: AC
  Administered 2022-09-02: 2 g via INTRAVENOUS
  Filled 2022-09-02: qty 100

## 2022-09-02 MED ORDER — ACETAMINOPHEN 325 MG PO TABS: 325.0000 mg | ORAL_TABLET | Freq: Four times a day (QID) | ORAL | Status: DC | PRN

## 2022-09-02 MED ORDER — ONDANSETRON HCL 4 MG/2ML IJ SOLN: 4.0000 mg | Freq: Four times a day (QID) | INTRAMUSCULAR | Status: DC | PRN

## 2022-09-02 MED ORDER — BUPIVACAINE HCL (PF) 0.25 % IJ SOLN
INTRAMUSCULAR | Status: AC
  Filled 2022-09-02: qty 30

## 2022-09-02 MED ORDER — ASPIRIN 81 MG PO CHEW: 81.0000 mg | CHEWABLE_TABLET | Freq: Two times a day (BID) | ORAL | 0 refills | Status: AC

## 2022-09-02 MED ORDER — ISOPROPYL ALCOHOL 70 % SOLN
Status: DC | PRN
  Administered 2022-09-02: 1 via TOPICAL

## 2022-09-02 MED ORDER — LACTATED RINGERS IV BOLUS
500.0000 mL | Freq: Once | INTRAVENOUS | Status: AC
  Administered 2022-09-02: 500 mL via INTRAVENOUS

## 2022-09-02 MED ORDER — METHOCARBAMOL 500 MG PO TABS: 500.0000 mg | ORAL_TABLET | Freq: Four times a day (QID) | ORAL | Status: DC | PRN

## 2022-09-02 MED ORDER — METHOCARBAMOL 500 MG IVPB - SIMPLE MED
INTRAVENOUS | Status: AC
  Filled 2022-09-02: qty 55

## 2022-09-02 MED ORDER — OXYCODONE HCL 5 MG PO TABS: 5.0000 mg | ORAL_TABLET | Freq: Once | ORAL | Status: DC | PRN

## 2022-09-02 MED ORDER — DEXAMETHASONE SODIUM PHOSPHATE 10 MG/ML IJ SOLN
INTRAMUSCULAR | Status: DC | PRN
  Administered 2022-09-02: 8 mg via INTRAVENOUS

## 2022-09-02 MED ORDER — PROMETHAZINE HCL 25 MG/ML IJ SOLN: 6.2500 mg | INTRAMUSCULAR | Status: DC | PRN

## 2022-09-02 MED ORDER — OXYCODONE HCL 5 MG/5ML PO SOLN: 5.0000 mg | Freq: Once | ORAL | Status: DC | PRN

## 2022-09-02 MED ORDER — MIDAZOLAM HCL 5 MG/5ML IJ SOLN
INTRAMUSCULAR | Status: DC | PRN
  Administered 2022-09-02 (×2): 1 mg via INTRAVENOUS

## 2022-09-02 MED ORDER — STERILE WATER FOR IRRIGATION IR SOLN
Status: DC | PRN
  Administered 2022-09-02: 2000 mL

## 2022-09-02 MED ORDER — OXYCODONE HCL 5 MG PO TABS
5.0000 mg | ORAL_TABLET | ORAL | Status: DC | PRN
  Administered 2022-09-02: 10 mg via ORAL

## 2022-09-02 MED ORDER — ACETAMINOPHEN 500 MG PO TABS: 1000.0000 mg | ORAL_TABLET | Freq: Once | ORAL | Status: DC

## 2022-09-02 MED ORDER — HYDROMORPHONE HCL 1 MG/ML IJ SOLN: 0.5000 mg | INTRAMUSCULAR | Status: DC | PRN

## 2022-09-02 MED ORDER — PROPOFOL 500 MG/50ML IV EMUL
INTRAVENOUS | Status: DC | PRN
  Administered 2022-09-02: 75 ug/kg/min via INTRAVENOUS

## 2022-09-02 MED ORDER — DOCUSATE SODIUM 100 MG PO CAPS: 100.0000 mg | ORAL_CAPSULE | Freq: Two times a day (BID) | ORAL | 1 refills | Status: AC

## 2022-09-02 MED ORDER — ONDANSETRON HCL 4 MG PO TABS: 4.0000 mg | ORAL_TABLET | Freq: Three times a day (TID) | ORAL | 0 refills | Status: DC | PRN

## 2022-09-02 MED ORDER — OXYCODONE HCL 5 MG PO TABS: 10.0000 mg | ORAL_TABLET | ORAL | Status: DC | PRN

## 2022-09-02 MED ORDER — KETOROLAC TROMETHAMINE 15 MG/ML IJ SOLN: 7.5000 mg | Freq: Four times a day (QID) | INTRAMUSCULAR | Status: DC

## 2022-09-02 MED ORDER — PROPOFOL 10 MG/ML IV BOLUS
INTRAVENOUS | Status: DC | PRN
  Administered 2022-09-02: 30 mg via INTRAVENOUS

## 2022-09-02 MED ORDER — FENTANYL CITRATE (PF) 100 MCG/2ML IJ SOLN
INTRAMUSCULAR | Status: DC | PRN
  Administered 2022-09-02 (×2): 50 ug via INTRAVENOUS

## 2022-09-02 MED ORDER — PROPOFOL 500 MG/50ML IV EMUL
INTRAVENOUS | Status: AC
  Filled 2022-09-02: qty 50

## 2022-09-02 SURGICAL SUPPLY — 69 items
ADH SKN CLS APL DERMABOND .7 (GAUZE/BANDAGES/DRESSINGS) ×1
APL PRP STRL LF DISP 70% ISPRP (MISCELLANEOUS) ×2
BAG COUNTER SPONGE SURGICOUNT (BAG) IMPLANT
BAG SPEC THK2 15X12 ZIP CLS (MISCELLANEOUS)
BAG SPNG CNTER NS LX DISP (BAG) ×1
BAG ZIPLOCK 12X15 (MISCELLANEOUS) IMPLANT
BATTERY INSTRU NAVIGATION (MISCELLANEOUS) ×3 IMPLANT
BLADE SAW RECIPROCATING 77.5 (BLADE) ×1 IMPLANT
BNDG ELASTIC 4X5.8 VLCR STR LF (GAUZE/BANDAGES/DRESSINGS) ×1 IMPLANT
BNDG ELASTIC 6X5.8 VLCR STR LF (GAUZE/BANDAGES/DRESSINGS) ×1 IMPLANT
BTRY SRG DRVR LF (MISCELLANEOUS) ×3
CHLORAPREP W/TINT 26 (MISCELLANEOUS) ×2 IMPLANT
COMP FEM PS KNEE STD 10 LT (Knees) ×1 IMPLANT
COMP TIB PS G 0D LT (Joint) ×1 IMPLANT
COMPONENT FEM PS KN STD 10 LT (Knees) IMPLANT
COMPONET TIB PS G 0D LT (Joint) IMPLANT
COVER SURGICAL LIGHT HANDLE (MISCELLANEOUS) ×1 IMPLANT
DERMABOND ADVANCED .7 DNX12 (GAUZE/BANDAGES/DRESSINGS) ×2 IMPLANT
DRAPE SHEET LG 3/4 BI-LAMINATE (DRAPES) ×3 IMPLANT
DRAPE U-SHAPE 47X51 STRL (DRAPES) ×1 IMPLANT
DRSG AQUACEL AG ADV 3.5X10 (GAUZE/BANDAGES/DRESSINGS) ×1 IMPLANT
ELECT BLADE TIP CTD 4 INCH (ELECTRODE) ×1 IMPLANT
ELECT REM PT RETURN 15FT ADLT (MISCELLANEOUS) ×1 IMPLANT
GAUZE SPONGE 4X4 12PLY STRL (GAUZE/BANDAGES/DRESSINGS) ×1 IMPLANT
GLOVE BIO SURGEON STRL SZ7 (GLOVE) ×1 IMPLANT
GLOVE BIO SURGEON STRL SZ8.5 (GLOVE) ×2 IMPLANT
GLOVE BIOGEL PI IND STRL 7.5 (GLOVE) ×1 IMPLANT
GLOVE BIOGEL PI IND STRL 8.5 (GLOVE) ×1 IMPLANT
GOWN SPEC L3 XXLG W/TWL (GOWN DISPOSABLE) ×1 IMPLANT
GOWN STRL REUS W/ TWL XL LVL3 (GOWN DISPOSABLE) ×1 IMPLANT
GOWN STRL REUS W/TWL XL LVL3 (GOWN DISPOSABLE) ×1
HANDPIECE INTERPULSE COAX TIP (DISPOSABLE) ×1
HDLS TROCR DRIL PIN KNEE 75 (PIN) ×2
HOLDER FOLEY CATH W/STRAP (MISCELLANEOUS) ×1 IMPLANT
HOOD PEEL AWAY T7 (MISCELLANEOUS) ×3 IMPLANT
KIT TURNOVER KIT A (KITS) IMPLANT
LINER TIB ASF PS GH/7-12 10 LT (Liner) IMPLANT
MARKER SKIN DUAL TIP RULER LAB (MISCELLANEOUS) ×1 IMPLANT
NDL SAFETY ECLIP 18X1.5 (MISCELLANEOUS) ×1 IMPLANT
NDL SPNL 18GX3.5 QUINCKE PK (NEEDLE) ×1 IMPLANT
NEEDLE SPNL 18GX3.5 QUINCKE PK (NEEDLE) ×1 IMPLANT
NS IRRIG 1000ML POUR BTL (IV SOLUTION) ×1 IMPLANT
PACK TOTAL KNEE CUSTOM (KITS) ×1 IMPLANT
PADDING CAST COTTON 6X4 STRL (CAST SUPPLIES) ×1 IMPLANT
PATELLA STD SZ 38X10 (Miscellaneous) IMPLANT
PIN DRILL HDLS TROCAR 75 4PK (PIN) IMPLANT
PROTECTOR NERVE ULNAR (MISCELLANEOUS) ×1 IMPLANT
SAW OSC TIP CART 19.5X105X1.3 (SAW) ×1 IMPLANT
SCREW FEMALE HEX FIX 25X2.5 (ORTHOPEDIC DISPOSABLE SUPPLIES) IMPLANT
SEALER BIPOLAR AQUA 6.0 (INSTRUMENTS) ×1 IMPLANT
SET HNDPC FAN SPRY TIP SCT (DISPOSABLE) ×1 IMPLANT
SET PAD KNEE POSITIONER (MISCELLANEOUS) ×1 IMPLANT
SOLUTION PRONTOSAN WOUND 350ML (IRRIGATION / IRRIGATOR) IMPLANT
SPIKE FLUID TRANSFER (MISCELLANEOUS) ×2 IMPLANT
SUT MNCRL AB 3-0 PS2 18 (SUTURE) ×1 IMPLANT
SUT MNCRL AB 4-0 PS2 18 (SUTURE) IMPLANT
SUT MON AB 2-0 CT1 36 (SUTURE) ×1 IMPLANT
SUT STRATAFIX PDO 1 14 VIOLET (SUTURE) ×1
SUT STRATFX PDO 1 14 VIOLET (SUTURE) ×1
SUT VIC AB 1 CTX 36 (SUTURE) ×2
SUT VIC AB 1 CTX36XBRD ANBCTR (SUTURE) ×2 IMPLANT
SUT VIC AB 2-0 CT1 27 (SUTURE) ×1
SUT VIC AB 2-0 CT1 TAPERPNT 27 (SUTURE) ×1 IMPLANT
SUTURE STRATFX PDO 1 14 VIOLET (SUTURE) ×1 IMPLANT
SYR 3ML LL SCALE MARK (SYRINGE) ×1 IMPLANT
TRAY FOLEY MTR SLVR 16FR STAT (SET/KITS/TRAYS/PACK) IMPLANT
TUBE SUCTION HIGH CAP CLEAR NV (SUCTIONS) ×1 IMPLANT
WATER STERILE IRR 1000ML POUR (IV SOLUTION) ×2 IMPLANT
WRAP KNEE MAXI GEL POST OP (GAUZE/BANDAGES/DRESSINGS) IMPLANT

## 2022-09-02 NOTE — Evaluation (Signed)
Physical Therapy Evaluation Patient Details Name: [name] [name] MRN: [id number] DOB: 1956/01/08 Today's Date: 09/02/2022  History of Present Illness  Pt is a 67yo male presenting s/p L-TKA on 09/03/23. PMH: ETOH abuse, fatty liver disease, GERD, gout, anterior cervical discectomy, back surgery, fusion of C6-7, HLD, chronic fatigue, neuropathy, OSA on CPAP   Clinical Impression  [name] [name] is a 67 y.o. male POD 0 s/p L-TKA. Patient reports independence with mobility at baseline. Patient is now limited by functional impairments (see PT problem list below) and requires min guard for transfers and gait with RW. Patient was able to ambulate 100 feet with RW and min guard and cues for safe walker management. Patient educated on safe sequencing for stair mobility and verbalized safe guarding position for people assisting with mobility. Patient instructed in exercises to facilitate ROM and circulation. Patient will benefit from continued skilled PT interventions to address impairments and progress towards PLOF. Patient has met mobility goals at adequate level for discharge home; will continue to follow if pt continues acute stay to progress towards Mod I goals.       Recommendations for follow up therapy are one component of a multi-disciplinary discharge planning process, led by the attending physician.  Recommendations may be updated based on patient status, additional functional criteria and insurance authorization.  Follow Up Recommendations Follow physician's recommendations for discharge plan and follow up therapies      Assistance Recommended at Discharge Frequent or constant Supervision/Assistance  Patient can return home with the following  A little help with walking and/or transfers;A little help with bathing/dressing/bathroom;Assistance with cooking/housework;Assist for transportation;Help with stairs or ramp for entrance    Equipment Recommendations None recommended by PT   Recommendations for Other Services       Functional Status Assessment Patient has had a recent decline in their functional status and demonstrates the ability to make significant improvements in function in a reasonable and predictable amount of time.     Precautions / Restrictions Precautions Precautions: ICD/Pacemaker;Knee Precaution Booklet Issued: No Precaution Comments: no pillow under the knee Restrictions Weight Bearing Restrictions: No Other Position/Activity Restrictions: wbat      Mobility  Bed Mobility Overal bed mobility: Needs Assistance Bed Mobility: Supine to Sit     Supine to sit: Supervision, HOB elevated     General bed mobility comments: For safety only    Transfers Overall transfer level: Needs assistance Equipment used: Rolling walker (2 wheels) Transfers: Sit to/from Stand Sit to Stand: Min guard, From elevated surface           General transfer comment: For safety only    Ambulation/Gait Ambulation/Gait assistance: Min guard Gait Distance (Feet): 100 Feet Assistive device: Rolling walker (2 wheels) Gait Pattern/deviations: Step-to pattern Gait velocity: decreased     General Gait Details: Pt ambulated with RW and min guard no physical assist required or overt LOB noted  Stairs            Wheelchair Mobility    Modified Rankin (Stroke Patients Only)       Balance Overall balance assessment: Needs assistance Sitting-balance support: Feet supported, No upper extremity supported Sitting balance-Leahy Scale: Good     Standing balance support: Reliant on assistive device for balance, During functional activity, Bilateral upper extremity supported Standing balance-Leahy Scale: Poor                               Pertinent Vitals/Pain Pain Assessment  Pain Assessment: 0-10 Pain Score: 0-No pain    Home Living Family/patient expects to be discharged to:: Private residence Living Arrangements:  Spouse/significant other Available Help at Discharge: Family;Available 24 hours/day Type of Home: House Home Access: Level entry       Home Layout: One level Home Equipment: Conservation officer, nature (2 wheels)      Prior Function Prior Level of Function : Independent/Modified Independent;Working/employed;Driving Agricultural consultant)             Mobility Comments: IND ADLs Comments: IND     Hand Dominance        Extremity/Trunk Assessment   Upper Extremity Assessment Upper Extremity Assessment: Overall WFL for tasks assessed    Lower Extremity Assessment Lower Extremity Assessment: RLE deficits/detail;LLE deficits/detail RLE Deficits / Details: MMT ank DF/PF 5/5 RLE Sensation: WNL LLE Deficits / Details: MMT ank DF/PF 5/5, no extensor lag ntoed LLE Sensation: WNL    Cervical / Trunk Assessment Cervical / Trunk Assessment: Normal  Communication   Communication: No difficulties  Cognition Arousal/Alertness: Awake/alert Behavior During Therapy: WFL for tasks assessed/performed Overall Cognitive Status: Within Functional Limits for tasks assessed                                          General Comments General comments (skin integrity, edema, etc.): Wife present    Exercises Total Joint Exercises Ankle Circles/Pumps: AROM, Both, 20 reps Quad Sets: AROM, Left, Other reps (comment) (2) Short Arc Quad: AROM, Left, Other reps (comment) (2) Heel Slides: AROM, Left, Other reps (comment) (2) Hip ABduction/ADduction: AROM, Other reps (comment), Left (2) Straight Leg Raises: AROM, Left, Other reps (comment) (2) Goniometric ROM: -5-45deg by gross visual approximation   Assessment/Plan    PT Assessment Patient needs continued PT services  PT Problem List Decreased strength;Decreased range of motion;Decreased activity tolerance;Decreased balance;Decreased mobility;Pain       PT Treatment Interventions DME instruction;Gait training;Stair training;Functional  mobility training;Therapeutic activities;Therapeutic exercise;Balance training;Neuromuscular re-education;Patient/family education    PT Goals (Current goals can be found in the Care Plan section)  Acute Rehab PT Goals Patient Stated Goal: To go home PT Goal Formulation: With patient/family Time For Goal Achievement: 09/09/22 Potential to Achieve Goals: Good    Frequency 7X/week     Co-evaluation               AM-PAC PT "6 Clicks" Mobility  Outcome Measure Help needed turning from your back to your side while in a flat bed without using bedrails?: None Help needed moving from lying on your back to sitting on the side of a flat bed without using bedrails?: A Little Help needed moving to and from a bed to a chair (including a wheelchair)?: A Little Help needed standing up from a chair using your arms (e.g., wheelchair or bedside chair)?: A Little Help needed to walk in hospital room?: A Little Help needed climbing 3-5 steps with a railing? : A Little 6 Click Score: 19    End of Session Equipment Utilized During Treatment: Gait belt Activity Tolerance: Patient tolerated treatment well;No increased pain Patient left: in chair;with call bell/phone within reach Nurse Communication: Mobility status PT Visit Diagnosis: Pain;Difficulty in walking, not elsewhere classified (R26.2) Pain - Right/Left: Left Pain - part of body: Knee    Time: 1315-1345 PT Time Calculation (min) (ACUTE ONLY): 30 min   Charges:   PT Evaluation $PT Eval Low Complexity:  1 Low PT Treatments $Gait Training: 8-22 mins        Coolidge Breeze, PT, DPT [facility] Department Office: [phone] Weekend pager: [phone]  Coolidge Breeze 09/02/2022, 2:05 PM

## 2022-09-02 NOTE — Discharge Instructions (Signed)
Dr. Rod Can Total Joint Specialist [facility] [address], [place], [place] [address] [phone]  TOTAL KNEE REPLACEMENT POSTOPERATIVE DIRECTIONS    Knee Rehabilitation, Guidelines Following Surgery  Results after knee surgery are often greatly improved when you follow the exercise, range of motion and muscle strengthening exercises prescribed by your doctor. Safety measures are also important to protect the knee from further injury. Any time any of these exercises cause you to have increased pain or swelling in your knee joint, decrease the amount until you are comfortable again and slowly increase them. If you have problems or questions, call your caregiver or physical therapist for advice.   WEIGHT BEARING Weight bearing as tolerated with assist device (walker, cane, etc) as directed, use it as long as suggested by your surgeon or therapist, typically at least 4-6 weeks.  HOME CARE INSTRUCTIONS  Remove items at home which could result in a fall. This includes throw rugs or furniture in walking pathways.  Continue medications as instructed at time of discharge. You may have some home medications which will be placed on hold until you complete the course of blood thinner medication.  You may start showering once you are discharged home but do not submerge the incision under water. Just pat the incision dry and apply a dry gauze dressing on daily. Walk with walker as instructed.  You may resume a sexual relationship in one month or when given the OK by your doctor.  Use walker as long as suggested by your caregivers. Avoid periods of inactivity such as sitting longer than an hour when not asleep. This helps prevent blood clots.  You may put full weight on your legs and walk as much as is comfortable.  You may return to work once you are cleared by your doctor.  Do not drive a car for 6 weeks or until released by you surgeon.  Do not drive while  taking narcotics.  Wear the elastic stockings for three weeks following surgery during the day but you may remove then at night. Make sure you keep all of your appointments after your operation with all of your doctors and caregivers. You should call the office at the above phone number and make an appointment for approximately two weeks after the date of your surgery. Do not remove your surgical dressing. The dressing is waterproof; you may take showers in 3 days, but do not take tub baths or submerge the dressing. Please pick up a stool softener and laxative for home use as long as you are requiring pain medications. ICE to the affected knee every three hours for 30 minutes at a time and then as needed for pain and swelling.  Continue to use ice on the knee for pain and swelling from surgery. You may notice swelling that will progress down to the foot and ankle.  This is normal after surgery.  Elevate the leg when you are not up walking on it.   It is important for you to complete the blood thinner medication as prescribed by your doctor. Continue to use the breathing machine which will help keep your temperature down.  It is common for your temperature to cycle up and down following surgery, especially at night when you are not up moving around and exerting yourself.  The breathing machine keeps your lungs expanded and your temperature down.  RANGE OF MOTION AND STRENGTHENING EXERCISES  Rehabilitation of the knee is important following a knee injury or an  operation. After just a few days of immobilization, the muscles of the thigh which control the knee become weakened and shrink (atrophy). Knee exercises are designed to build up the tone and strength of the thigh muscles and to improve knee motion. Often times heat used for twenty to thirty minutes before working out will loosen up your tissues and help with improving the range of motion but do not use heat for the first two weeks following surgery.  These exercises can be done on a training (exercise) mat, on the floor, on a table or on a bed. Use what ever works the best and is most comfortable for you Knee exercises include:  Leg Lifts - While your knee is still immobilized in a splint or cast, you can do straight leg raises. Lift the leg to 60 degrees, hold for 3 sec, and slowly lower the leg. Repeat 10-20 times 2-3 times daily. Perform this exercise against resistance later as your knee gets better.  Quad and Hamstring Sets - Tighten up the muscle on the front of the thigh (Quad) and hold for 5-10 sec. Repeat this 10-20 times hourly. Hamstring sets are done by pushing the foot backward against an object and holding for 5-10 sec. Repeat as with quad sets.  A rehabilitation program following serious knee injuries can speed recovery and prevent re-injury in the future due to weakened muscles. Contact your doctor or a physical therapist for more information on knee rehabilitation.   POST-OPERATIVE OPIOID TAPER INSTRUCTIONS: It is important to wean off of your opioid medication as soon as possible. If you do not need pain medication after your surgery it is ok to stop day one. Opioids include: Codeine, Hydrocodone(Norco, Vicodin), Oxycodone(Percocet, oxycontin) and hydromorphone amongst others.  Long term and even short term use of opiods can cause: Increased pain response Dependence Constipation Depression Respiratory depression And more.  Withdrawal symptoms can include Flu like symptoms Nausea, vomiting And more Techniques to manage these symptoms Hydrate well Eat regular healthy meals Stay active Use relaxation techniques(deep breathing, meditating, yoga) Do Not substitute Alcohol to help with tapering If you have been on opioids for less than two weeks and do not have pain than it is ok to stop all together.  Plan to wean off of opioids This plan should start within one week post op of your joint replacement. Maintain the same  interval or time between taking each dose and first decrease the dose.  Cut the total daily intake of opioids by one tablet each day Next start to increase the time between doses. The last dose that should be eliminated is the evening dose.    SKILLED REHAB INSTRUCTIONS: If the patient is transferred to a skilled rehab facility following release from the hospital, a list of the current medications will be sent to the facility for the patient to continue.  When discharged from the skilled rehab facility, please have the facility set up the patient's [place] prior to being released. Also, the skilled facility will be responsible for providing the patient with their medications at time of release from the facility to include their pain medication, the muscle relaxants, and their blood thinner medication. If the patient is still at the rehab facility at time of the two week follow up appointment, the skilled rehab facility will also need to assist the patient in arranging follow up appointment in our office and any transportation needs.  MAKE SURE YOU:  Understand these instructions.  Will watch  your condition.  Will get help right away if you are not doing well or get worse.    Pick up stool softner and laxative for home use following surgery while on pain medications. Do NOT remove your dressing. You may shower.  Do not take tub baths or submerge incision under water. May shower starting three days after surgery. Please use a clean towel to pat the incision dry following showers. Continue to use ice for pain and swelling after surgery. Do not use any lotions or creams on the incision until instructed by your surgeon.  

## 2022-09-02 NOTE — Anesthesia Procedure Notes (Signed)
Spinal  Patient location during procedure: OR Start time: 09/02/2022 7:19 AM End time: 09/02/2022 7:24 AM Reason for block: surgical anesthesia Staffing Performed: anesthesiologist  Anesthesiologist: Lynda Rainwater, MD Performed by: Lynda Rainwater, MD Authorized by: Lynda Rainwater, MD   Preanesthetic Checklist Completed: patient identified, IV checked, site marked, risks and benefits discussed, surgical consent, monitors and equipment checked, pre-op evaluation and timeout performed Spinal Block Patient position: sitting Prep: DuraPrep Patient monitoring: heart rate, cardiac monitor, continuous pulse ox and blood pressure Approach: midline Location: L3-4 Injection technique: single-shot Needle Needle type: [place]  Needle gauge: 22 G Needle length: 9 cm Assessment Sensory level: T4 Events: CSF return

## 2022-09-02 NOTE — Anesthesia Postprocedure Evaluation (Signed)
Anesthesia Post Note  Patient: Yakov Bergen  Procedure(s) Performed: COMPUTER ASSISTED TOTAL KNEE ARTHROPLASTY (Left: Knee)     Patient location during evaluation: PACU Anesthesia Type: Regional and Spinal Level of consciousness: awake and alert Pain management: pain level controlled Vital Signs Assessment: post-procedure vital signs reviewed and stable Respiratory status: spontaneous breathing, nonlabored ventilation and respiratory function stable Cardiovascular status: blood pressure returned to baseline and stable Postop Assessment: no apparent nausea or vomiting Anesthetic complications: no   No notable events documented.  Last Vitals:  Vitals:   09/02/22 1100 09/02/22 1105  BP: 124/80 124/80  Pulse: 75 90  Resp: 14 16  Temp: 36.7 C 36.7 C  SpO2: 93% 94%    Last Pain:  Vitals:   09/02/22 1105  TempSrc:   PainSc: [place]

## 2022-09-02 NOTE — Interval H&P Note (Signed)
History and Physical Interval Note:  09/02/2022 6:48 AM  [name] [name]  has presented today for surgery, with the diagnosis of Left knee osteoarthritis.  The various methods of treatment have been discussed with the patient and family. After consideration of risks, benefits and other options for treatment, the patient has consented to  Procedure(s) with comments: [place] (Left) - 160 as a surgical intervention.  The patient's history has been reviewed, patient examined, no change in status, stable for surgery.  I have reviewed the patient's chart and labs.  Questions were answered to the patient's satisfaction.     Hilton Cork Hieu Herms

## 2022-09-02 NOTE — Anesthesia Procedure Notes (Signed)
Anesthesia Regional Block: Adductor canal block   Pre-Anesthetic Checklist: , timeout performed,  Correct Patient, Correct Site, Correct Laterality,  Correct Procedure, Correct Position, site marked,  Risks and benefits discussed,  Surgical consent,  Pre-op evaluation,  At surgeon's request and post-op pain management  Laterality: Left  Prep: chloraprep       Needles:  Injection technique: Single-shot  Needle Type: Stimiplex     Needle Length: 9cm  Needle Gauge: 21     Additional Needles:   Procedures:,,,, ultrasound used (permanent image in chart),,    Narrative:  Start time: 09/02/2022 7:02 AM End time: 09/02/2022 7:07 AM Injection made incrementally with aspirations every 5 mL.  Performed by: Personally  Anesthesiologist: Lynda Rainwater, MD

## 2022-09-02 NOTE — Transfer of Care (Signed)
Immediate Anesthesia Transfer of Care Note  Patient: [name] [name]  Procedure(s) Performed: Procedure(s) with comments: COMPUTER ASSISTED TOTAL KNEE ARTHROPLASTY (Left) - 160  Patient Location: PACU  Anesthesia Type:Spinal  Level of Consciousness:  sedated, patient cooperative and responds to stimulation  Airway & Oxygen Therapy:Patient Spontanous Breathing and Patient connected to face mask oxgen  Post-op Assessment:  Report given to PACU RN and Post -op Vital signs reviewed and stable  Post vital signs:  Reviewed and stable  Last Vitals:  Vitals:   09/02/22 0605  BP: 127/84  Pulse: 76  Resp: 18  Temp: 36.6 C  SpO2: 85%    Complications: No apparent anesthesia complications

## 2022-09-02 NOTE — Anesthesia Preprocedure Evaluation (Signed)
Anesthesia Evaluation  Patient identified by MRN, date of birth, ID band Patient awake    Reviewed: Allergy & Precautions, H&P , NPO status , Patient's Chart, lab work & pertinent test results  Airway Mallampati: II  TM Distance: >3 FB Neck ROM: Full    Dental no notable dental hx.    Pulmonary sleep apnea , former smoker   Pulmonary exam normal breath sounds clear to auscultation       Cardiovascular negative cardio ROS Normal cardiovascular exam Rhythm:Regular Rate:Normal     Neuro/Psych   Anxiety Depression    negative neurological ROS  negative psych ROS   GI/Hepatic negative GI ROS, Neg liver ROS,,,  Endo/Other  negative endocrine ROS    Renal/GU negative Renal ROS  negative genitourinary   Musculoskeletal  (+) Arthritis , Osteoarthritis,    Abdominal  (+) + obese  Peds negative pediatric ROS (+)  Hematology negative hematology ROS (+)   Anesthesia Other Findings   Reproductive/Obstetrics negative OB ROS                             Anesthesia Physical Anesthesia Plan  ASA: 2  Anesthesia Plan: Regional and Spinal   Post-op Pain Management: Regional block* and Minimal or no pain anticipated   Induction: Intravenous  PONV Risk Score and Plan: 1 and Ondansetron and Treatment may vary due to age or medical condition  Airway Management Planned: Simple Face Mask  Additional Equipment:   Intra-op Plan:   Post-operative Plan:   Informed Consent: I have reviewed the patients History and Physical, chart, labs and discussed the procedure including the risks, benefits and alternatives for the proposed anesthesia with the patient or authorized representative who has indicated his/her understanding and acceptance.     Dental advisory given  Plan Discussed with: CRNA  Anesthesia Plan Comments:        Anesthesia Quick Evaluation

## 2022-09-02 NOTE — Op Note (Signed)
OPERATIVE REPORT  SURGEON: Rod Can, MD   ASSISTANT: Larene Pickett, PA-C  PREOPERATIVE DIAGNOSIS: Primary Left knee arthritis.   POSTOPERATIVE DIAGNOSIS: Primary Left knee arthritis.   PROCEDURE: Computer assisted Left total knee arthroplasty.   IMPLANTS: Zimmer Persona PPS Cementless CR femur, size 10. Persona 0 degree Spiked Keel OsseoTi Tibia, size G. Vivacit-E polyethelyene insert, size 10 mm, CR. TM standard patella, size 38 mm.  ANESTHESIA:  MAC, Regional, and Spinal  TOURNIQUET TIME: Not utilized.   ESTIMATED BLOOD LOSS:-150 mL    ANTIBIOTICS: 2g Ancef.  DRAINS: None.  COMPLICATIONS: None   CONDITION: PACU - hemodynamically stable.   BRIEF CLINICAL NOTE: [name] [name] is a 67 y.o. male with a long-standing history of Left knee arthritis. After failing conservative management, the patient was indicated for total knee arthroplasty. The risks, benefits, and alternatives to the procedure were explained, and the patient elected to proceed.  PROCEDURE IN DETAIL: Adductor canal block was obtained in the pre-op holding area. Once inside the operative room, spinal anesthesia was obtained, and a foley catheter was inserted. The patient was then positioned and the lower extremity was prepped and draped in the normal sterile surgical fashion.  A time-out was called verifying side and site of surgery. The patient received IV antibiotics within 60 minutes of beginning the procedure. A tourniquet was not utilized.   An anterior approach to the knee was performed utilizing a midvastus arthrotomy. A medial release was performed and the patellar fat pad was excised. Stryker imageless navigation was used to cut the distal femur perpendicular to the mechanical axis. A freehand patellar resection was performed, and the patella was sized an prepared with 3 lug holes.  Nagivation was used to make a neutral proximal tibia resection, taking 4 mm of bone from the less affected medial side with  3 degrees of slope. The menisci were excised. A spacer block was placed, and the alignment and balance in extension were confirmed.   The distal femur was sized using the 3-degree external rotation guide referencing the posterior femoral cortex. The appropriate 4-in-1 cutting block was pinned into place. Rotation was checked using Whiteside's line, the epicondylar axis, and then confirmed with a spacer block in flexion. The remaining femoral cuts were performed, taking care to protect the MCL.  The tibia was sized and the trial tray was pinned into place. The remaining trail components were inserted. The knee was stable to varus and valgus stress through a full range of motion. The patella tracked centrally, and the PCL was well balanced. The trial components were removed, and the proximal tibial surface was prepared. Final components were impacted into place. The knee was tested for a final time and found to be well balanced.   The wound was copiously irrigated with Prontosan solution and normal saline using pule lavage.  Marcaine solution was injected into the periarticular soft tissue.  The wound was closed in layers using #1 Vicryl and Stratafix for the fascia, 2-0 Vicryl for the subcutaneous fat, 2-0 Monocryl for the deep dermal layer, 3-0 running Monocryl subcuticular Stitch, and 4-0 Monocryl stay sutures at both ends of the wound. Dermabond was applied to the skin.  Once the glue was fully dried, an Aquacell Ag and compressive dressing were applied.  The patient was transported to the recovery room in stable condition.  Sponge, needle, and instrument counts were correct at the end of the case x2.  The patient tolerated the procedure well and there were no known complications.  Please note that a surgical assistant was a medical necessity for this procedure in order to perform it in a safe and expeditious manner. Surgical assistant was necessary to retract the ligaments and vital neurovascular  structures to prevent injury to them and also necessary for proper positioning of the limb to allow for anatomic placement of the prosthesis.

## 2022-09-03 ENCOUNTER — Encounter (HOSPITAL_COMMUNITY): Payer: Self-pay | Admitting: Orthopedic Surgery

## 2022-09-07 DIAGNOSIS — M25662 Stiffness of left knee, not elsewhere classified: Secondary | ICD-10-CM | POA: Diagnosis not present

## 2022-09-07 DIAGNOSIS — M25562 Pain in left knee: Secondary | ICD-10-CM | POA: Diagnosis not present

## 2022-09-09 DIAGNOSIS — M25662 Stiffness of left knee, not elsewhere classified: Secondary | ICD-10-CM | POA: Diagnosis not present

## 2022-09-09 DIAGNOSIS — M25562 Pain in left knee: Secondary | ICD-10-CM | POA: Diagnosis not present

## 2022-09-14 DIAGNOSIS — M25662 Stiffness of left knee, not elsewhere classified: Secondary | ICD-10-CM | POA: Diagnosis not present

## 2022-09-14 DIAGNOSIS — M25562 Pain in left knee: Secondary | ICD-10-CM | POA: Diagnosis not present

## 2022-09-15 DIAGNOSIS — Z96652 Presence of left artificial knee joint: Secondary | ICD-10-CM | POA: Diagnosis not present

## 2022-09-16 DIAGNOSIS — M25662 Stiffness of left knee, not elsewhere classified: Secondary | ICD-10-CM | POA: Diagnosis not present

## 2022-09-16 DIAGNOSIS — M25562 Pain in left knee: Secondary | ICD-10-CM | POA: Diagnosis not present

## 2022-09-21 ENCOUNTER — Other Ambulatory Visit: Payer: Self-pay | Admitting: Gastroenterology

## 2022-09-21 DIAGNOSIS — M25662 Stiffness of left knee, not elsewhere classified: Secondary | ICD-10-CM | POA: Diagnosis not present

## 2022-09-21 DIAGNOSIS — K219 Gastro-esophageal reflux disease without esophagitis: Secondary | ICD-10-CM

## 2022-09-21 DIAGNOSIS — M25562 Pain in left knee: Secondary | ICD-10-CM | POA: Diagnosis not present

## 2022-09-23 ENCOUNTER — Ambulatory Visit
Admission: RE | Admit: 2022-09-23 | Discharge: 2022-09-23 | Disposition: A | Discharge status: Home / Self Care | Payer: BC Managed Care – PPO | Source: Ambulatory Visit | Attending: Family Medicine | Admitting: Family Medicine

## 2022-09-23 DIAGNOSIS — M25562 Pain in left knee: Secondary | ICD-10-CM | POA: Diagnosis not present

## 2022-09-23 DIAGNOSIS — K824 Cholesterolosis of gallbladder: Secondary | ICD-10-CM | POA: Diagnosis not present

## 2022-09-23 DIAGNOSIS — M25662 Stiffness of left knee, not elsewhere classified: Secondary | ICD-10-CM | POA: Diagnosis not present

## 2022-09-28 DIAGNOSIS — M25662 Stiffness of left knee, not elsewhere classified: Secondary | ICD-10-CM | POA: Diagnosis not present

## 2022-09-28 DIAGNOSIS — M25562 Pain in left knee: Secondary | ICD-10-CM | POA: Diagnosis not present

## 2022-09-29 ENCOUNTER — Encounter: Payer: Self-pay | Admitting: Gastroenterology

## 2022-09-30 DIAGNOSIS — M25662 Stiffness of left knee, not elsewhere classified: Secondary | ICD-10-CM | POA: Diagnosis not present

## 2022-09-30 DIAGNOSIS — M25562 Pain in left knee: Secondary | ICD-10-CM | POA: Diagnosis not present

## 2022-10-05 DIAGNOSIS — M25562 Pain in left knee: Secondary | ICD-10-CM | POA: Diagnosis not present

## 2022-10-05 DIAGNOSIS — M25662 Stiffness of left knee, not elsewhere classified: Secondary | ICD-10-CM | POA: Diagnosis not present

## 2022-10-07 DIAGNOSIS — M25662 Stiffness of left knee, not elsewhere classified: Secondary | ICD-10-CM | POA: Diagnosis not present

## 2022-10-07 DIAGNOSIS — M25562 Pain in left knee: Secondary | ICD-10-CM | POA: Diagnosis not present

## 2022-10-08 ENCOUNTER — Ambulatory Visit: Payer: BC Managed Care – PPO | Admitting: *Deleted

## 2022-10-13 DIAGNOSIS — M25662 Stiffness of left knee, not elsewhere classified: Secondary | ICD-10-CM | POA: Diagnosis not present

## 2022-10-13 DIAGNOSIS — Z471 Aftercare following joint replacement surgery: Secondary | ICD-10-CM | POA: Diagnosis not present

## 2022-10-13 DIAGNOSIS — Z96652 Presence of left artificial knee joint: Secondary | ICD-10-CM | POA: Diagnosis not present

## 2022-10-13 DIAGNOSIS — M25562 Pain in left knee: Secondary | ICD-10-CM | POA: Diagnosis not present

## 2022-10-20 ENCOUNTER — Ambulatory Visit: Payer: BC Managed Care – PPO | Admitting: *Deleted

## 2022-10-23 DIAGNOSIS — M1612 Unilateral primary osteoarthritis, left hip: Secondary | ICD-10-CM | POA: Diagnosis not present

## 2022-11-04 ENCOUNTER — Encounter: Payer: Self-pay | Admitting: Dietician

## 2022-11-04 ENCOUNTER — Encounter: Payer: BC Managed Care – PPO | Attending: Family Medicine | Admitting: Dietician

## 2022-11-04 VITALS — Wt 244.0 lb

## 2022-11-04 DIAGNOSIS — E119 Type 2 diabetes mellitus without complications: Secondary | ICD-10-CM

## 2022-11-04 DIAGNOSIS — Z713 Dietary counseling and surveillance: Secondary | ICD-10-CM | POA: Insufficient documentation

## 2022-11-04 DIAGNOSIS — E785 Hyperlipidemia, unspecified: Secondary | ICD-10-CM | POA: Insufficient documentation

## 2022-11-04 DIAGNOSIS — E1169 Type 2 diabetes mellitus with other specified complication: Secondary | ICD-10-CM | POA: Insufficient documentation

## 2022-11-04 NOTE — Patient Instructions (Addendum)
Aim for 150 minutes of physical activity weekly. Goal: Use the recumbent bike 5 days a week for 25 minutes.   Goal: aim for 64 oz of water daily, feel free to flavor with lime, lemon, cucumber, mint, herbal tea, etc.   At meal times: aim to include a 1/2 plate non-starchy vegetables, 1/4 plate protein, and 1/4 plate complex carbohydrates.  At snacks: aim to include a complex carbohydrate and protein.  Examples: apple and peanut butter, fruit and nuts, cheese and crackers,

## 2022-11-04 NOTE — Progress Notes (Signed)
Diabetes Self-Management Education  Visit Type: First/Initial  Appt. Start Time: 1355 Appt. End Time: [id number]  11/04/2022  [name], identified by name and date of birth, is a 67 y.o. male with a diagnosis of Diabetes: Type 2.   ASSESSMENT  Primary concern: Pt wants to prevent diabetic complications and wants to reduce his blood glucose.   History includes: anxiety, depression, GERD, glaucoma, sleep apnea, osteoporosis, type 2 diabetes Labs noted: 08/2022 6.9% Medications: reviewed Supplements: zinc, magnesium, vitamin D  Pt is present today with his wife.   Pt states he quit drinking soft drinks and sweet tea. Pt's wife says now sometimes when she makes tea she uses 1/4 cup of sugar per gallon container.   Pt reports he is checking his blood glucose in the morning. Pt reports his fasting blood glucose is 96mg /dL.  Pt states has a recumbant bike and uses it for 25 minutes 4x/wk.   Pt states he had knee surgery and has been out of work but is going back next month. He states his eating schedule is more regular when he is working.   Pt's wife states pt eats a lot of food in the evenings and continues to snack following dinner.   Weight 244 lb (110.7 kg). Body mass index is 35.01 kg/m.   Diabetes Self-Management Education - 11/04/22 1352       Visit Information   Visit Type First/Initial      Initial Visit   Diabetes Type Type 2    Date Diagnosed 08/2022    Are you currently following a meal plan? No    Are you taking your medications as prescribed? Not on Medications      Health Coping   How would you rate your overall health? Fair      Psychosocial Assessment   Patient Belief/Attitude about Diabetes Motivated to manage diabetes    What is the hardest part about your diabetes right now, causing you the most concern, or is the most worrisome to you about your diabetes?   Making healty food and beverage choices    Self-care barriers None    Self-management  support Doctor's office;Family    Other persons present Patient;Spouse/SO    Patient Concerns Nutrition/Meal planning    Special Needs None    Preferred Learning Style No preference indicated    Learning Readiness Ready    How often do you need to have someone help you when you read instructions, pamphlets, or other written materials from your doctor or pharmacy? 3 - Sometimes    What is the last grade level you completed in school? 12th      Pre-Education Assessment   Patient understands the diabetes disease and treatment process. Needs Instruction    Patient understands incorporating nutritional management into lifestyle. Needs Instruction    Patient undertands incorporating physical activity into lifestyle. Needs Instruction    Patient understands using medications safely. Needs Instruction    Patient understands monitoring blood glucose, interpreting and using results Needs Instruction    Patient understands prevention, detection, and treatment of acute complications. Needs Instruction    Patient understands prevention, detection, and treatment of chronic complications. Needs Instruction    Patient understands how to develop strategies to address psychosocial issues. Needs Instruction    Patient understands how to develop strategies to promote health/change behavior. Needs Instruction      Complications   Last HgB A1C per patient/outside source 6.9 %    How often do you check your  blood sugar? 1-2 times/day    Fasting Blood glucose range (mg/dL) 70-129    Postprandial Blood glucose range (mg/dL) 180-200;>200    Have you had a dilated eye exam in the past 12 months? Yes    Have you had a dental exam in the past 12 months? Yes    Are you checking your feet? No      Dietary Intake   Breakfast cereal with berries and nuts OR oatmeal with fruit, maple syrup, and walnuts    Snack (morning) 10am: half of lunch: meat, potatoes, and beans    Lunch 12pm: half of packed lunch    Snack  (afternoon) 3pm: apple OR fruit, 5pm: chicken wings    Dinner meat, potatoes, beans    Snack (evening) ice cream OR poptarts    Beverage(s) 32 oz water, coffee with honey, lightly sweetened tea      Activity / Exercise   Activity / Exercise Type Light (walking / raking leaves)    How many days per week do you exercise? 4    How many minutes per day do you exercise? 25    Total minutes per week of exercise 100      Patient Education   Previous Diabetes Education No    Disease Pathophysiology Factors that contribute to the development of diabetes;Definition of diabetes, type 1 and 2, and the diagnosis of diabetes;Explored patient's options for treatment of their diabetes    Healthy Eating Role of diet in the treatment of diabetes and the relationship between the three main macronutrients and blood glucose level;Food label reading, portion sizes and measuring food.;Plate Method;Reviewed blood glucose goals for pre and post meals and how to evaluate the patients' food intake on their blood glucose level.;Meal timing in regards to the patients' current diabetes medication.;Information on hints to eating out and maintain blood glucose control.;Meal options for control of blood glucose level and chronic complications.    Being Active Role of exercise on diabetes management, blood pressure control and cardiac health.;Helped patient identify appropriate exercises in relation to his/her diabetes, diabetes complications and other health issue.    Monitoring Interpreting lab values - A1C, lipid, urine microalbumina.;Daily foot exams;Yearly dilated eye exam    Acute complications Taught prevention, symptoms, and  treatment of hypoglycemia - the 15 rule.;Discussed and identified patients' prevention, symptoms, and treatment of hyperglycemia.    Chronic complications Relationship between chronic complications and blood glucose control;Assessed and discussed foot care and prevention of foot problems;Lipid levels,  blood glucose control and heart disease;Identified and discussed with patient  current chronic complications;Dental care;Nephropathy, what it is, prevention of, the use of ACE, ARB's and early detection of through urine microalbumia.;Retinopathy and reason for yearly dilated eye exams;Reviewed with patient heart disease, higher risk of, and prevention    Diabetes Stress and Support Identified and addressed patients feelings and concerns about diabetes;Worked with patient to identify barriers to care and solutions;Role of stress on diabetes    Lifestyle and Health Coping Lifestyle issues that need to be addressed for better diabetes care      Individualized Goals (developed by patient)   Nutrition General guidelines for healthy choices and portions discussed    Physical Activity Exercise 3-5 times per week;30 minutes per day    Medications take my medication as prescribed    Monitoring  Test my blood glucose as discussed    Problem Solving Eating Pattern    Reducing Risk examine blood glucose patterns;do foot checks daily;treat hypoglycemia with 15 grams  of carbs if blood glucose less than 70mg /dL    Health Coping Ask for help with psychological, social, or emotional issues      Post-Education Assessment   Patient understands the diabetes disease and treatment process. Comprehends key points    Patient understands incorporating nutritional management into lifestyle. Comprehends key points    Patient undertands incorporating physical activity into lifestyle. Comprehends key points    Patient understands using medications safely. N/A    Patient understands monitoring blood glucose, interpreting and using results Comprehends key points    Patient understands prevention, detection, and treatment of acute complications. Comprehends key points    Patient understands prevention, detection, and treatment of chronic complications. Comprehends key points    Patient understands how to develop strategies to  address psychosocial issues. Comprehends key points    Patient understands how to develop strategies to promote health/change behavior. Comprehends key points      Outcomes   Expected Outcomes Demonstrated interest in learning. Expect positive outcomes    Future DMSE PRN    Program Status Completed             Individualized Plan for Diabetes Self-Management Training:   Learning Objective:  Patient will have a greater understanding of diabetes self-management. Patient education plan is to attend individual and/or group sessions per assessed needs and concerns.   Plan:   Patient Instructions  Aim for 150 minutes of physical activity weekly. Goal: Use the recumbent bike 5 days a week for 25 minutes.   Goal: aim for 64 oz of water daily, feel free to flavor with lime, lemon, cucumber, mint, herbal tea, etc.   At meal times: aim to include a 1/2 plate non-starchy vegetables, 1/4 plate protein, and 1/4 plate complex carbohydrates.  At snacks: aim to include a complex carbohydrate and protein.  Examples: apple and peanut butter, fruit and nuts, cheese and crackers,   Expected Outcomes:  Demonstrated interest in learning. Expect positive outcomes  Education material provided: ADA - How to Thrive: A Guide for Your Journey with Diabetes, Meal Ideas  If problems or questions, patient to contact team via:  Phone  Future DSME appointment: PRN

## 2022-11-12 DIAGNOSIS — G4733 Obstructive sleep apnea (adult) (pediatric): Secondary | ICD-10-CM | POA: Diagnosis not present

## 2022-12-11 DIAGNOSIS — R7989 Other specified abnormal findings of blood chemistry: Secondary | ICD-10-CM | POA: Diagnosis not present

## 2022-12-11 DIAGNOSIS — M109 Gout, unspecified: Secondary | ICD-10-CM | POA: Diagnosis not present

## 2022-12-11 DIAGNOSIS — E7800 Pure hypercholesterolemia, unspecified: Secondary | ICD-10-CM | POA: Diagnosis not present

## 2022-12-11 DIAGNOSIS — K219 Gastro-esophageal reflux disease without esophagitis: Secondary | ICD-10-CM | POA: Diagnosis not present

## 2022-12-11 DIAGNOSIS — E1169 Type 2 diabetes mellitus with other specified complication: Secondary | ICD-10-CM | POA: Diagnosis not present

## 2022-12-11 DIAGNOSIS — Z79899 Other long term (current) drug therapy: Secondary | ICD-10-CM | POA: Diagnosis not present

## 2023-01-05 ENCOUNTER — Other Ambulatory Visit: Payer: Self-pay | Admitting: Family Medicine

## 2023-03-12 DIAGNOSIS — M5136 Other intervertebral disc degeneration, lumbar region: Secondary | ICD-10-CM | POA: Diagnosis not present

## 2023-03-12 DIAGNOSIS — M545 Low back pain, unspecified: Secondary | ICD-10-CM | POA: Diagnosis not present

## 2023-03-12 DIAGNOSIS — M47816 Spondylosis without myelopathy or radiculopathy, lumbar region: Secondary | ICD-10-CM | POA: Diagnosis not present

## 2023-04-06 DIAGNOSIS — M542 Cervicalgia: Secondary | ICD-10-CM | POA: Diagnosis not present

## 2023-04-06 DIAGNOSIS — M961 Postlaminectomy syndrome, not elsewhere classified: Secondary | ICD-10-CM | POA: Diagnosis not present

## 2023-04-15 DIAGNOSIS — G4733 Obstructive sleep apnea (adult) (pediatric): Secondary | ICD-10-CM | POA: Diagnosis not present

## 2023-04-16 DIAGNOSIS — K219 Gastro-esophageal reflux disease without esophagitis: Secondary | ICD-10-CM | POA: Diagnosis not present

## 2023-04-16 DIAGNOSIS — M109 Gout, unspecified: Secondary | ICD-10-CM | POA: Diagnosis not present

## 2023-04-16 DIAGNOSIS — E1169 Type 2 diabetes mellitus with other specified complication: Secondary | ICD-10-CM | POA: Diagnosis not present

## 2023-04-16 DIAGNOSIS — Z79899 Other long term (current) drug therapy: Secondary | ICD-10-CM | POA: Diagnosis not present

## 2023-04-16 DIAGNOSIS — E7800 Pure hypercholesterolemia, unspecified: Secondary | ICD-10-CM | POA: Diagnosis not present

## 2023-04-16 DIAGNOSIS — Z139 Encounter for screening, unspecified: Secondary | ICD-10-CM | POA: Diagnosis not present

## 2023-04-16 DIAGNOSIS — R7989 Other specified abnormal findings of blood chemistry: Secondary | ICD-10-CM | POA: Diagnosis not present

## 2023-04-21 DIAGNOSIS — G4733 Obstructive sleep apnea (adult) (pediatric): Secondary | ICD-10-CM | POA: Diagnosis not present

## 2023-04-22 DIAGNOSIS — M542 Cervicalgia: Secondary | ICD-10-CM | POA: Diagnosis not present

## 2023-05-05 DIAGNOSIS — M542 Cervicalgia: Secondary | ICD-10-CM | POA: Diagnosis not present

## 2023-06-16 DIAGNOSIS — M79671 Pain in right foot: Secondary | ICD-10-CM | POA: Diagnosis not present

## 2023-06-16 DIAGNOSIS — M7661 Achilles tendinitis, right leg: Secondary | ICD-10-CM | POA: Diagnosis not present

## 2023-07-15 DIAGNOSIS — G4733 Obstructive sleep apnea (adult) (pediatric): Secondary | ICD-10-CM | POA: Diagnosis not present

## 2023-08-20 ENCOUNTER — Other Ambulatory Visit: Payer: Self-pay | Admitting: Family Medicine

## 2023-08-20 DIAGNOSIS — E1169 Type 2 diabetes mellitus with other specified complication: Secondary | ICD-10-CM | POA: Diagnosis not present

## 2023-08-20 DIAGNOSIS — Z79899 Other long term (current) drug therapy: Secondary | ICD-10-CM | POA: Diagnosis not present

## 2023-08-20 DIAGNOSIS — E782 Mixed hyperlipidemia: Secondary | ICD-10-CM | POA: Diagnosis not present

## 2023-08-20 DIAGNOSIS — M109 Gout, unspecified: Secondary | ICD-10-CM | POA: Diagnosis not present

## 2023-08-20 DIAGNOSIS — K824 Cholesterolosis of gallbladder: Secondary | ICD-10-CM

## 2023-08-20 DIAGNOSIS — R7989 Other specified abnormal findings of blood chemistry: Secondary | ICD-10-CM | POA: Diagnosis not present

## 2023-08-20 DIAGNOSIS — Z125 Encounter for screening for malignant neoplasm of prostate: Secondary | ICD-10-CM | POA: Diagnosis not present

## 2023-08-20 DIAGNOSIS — K219 Gastro-esophageal reflux disease without esophagitis: Secondary | ICD-10-CM | POA: Diagnosis not present

## 2023-09-03 ENCOUNTER — Ambulatory Visit
Admission: RE | Admit: 2023-09-03 | Discharge: 2023-09-03 | Disposition: A | Discharge status: Home / Self Care | Payer: BC Managed Care – PPO | Source: Ambulatory Visit | Attending: Family Medicine | Admitting: Family Medicine

## 2023-09-03 DIAGNOSIS — K821 Hydrops of gallbladder: Secondary | ICD-10-CM | POA: Diagnosis not present

## 2023-09-03 DIAGNOSIS — K824 Cholesterolosis of gallbladder: Secondary | ICD-10-CM

## 2023-09-10 ENCOUNTER — Telehealth: Payer: Self-pay

## 2023-09-10 NOTE — Telephone Encounter (Signed)
Left a message for the patient to call the office, patient is being referred for gallbladder

## 2023-09-17 ENCOUNTER — Ambulatory Visit (INDEPENDENT_AMBULATORY_CARE_PROVIDER_SITE_OTHER): Payer: BC Managed Care – PPO | Admitting: Surgery

## 2023-09-17 ENCOUNTER — Encounter: Payer: Self-pay | Admitting: Surgery

## 2023-09-17 VITALS — BP 140/79 | HR 79 | Temp 98.2°F | Wt 240.0 lb

## 2023-09-17 DIAGNOSIS — K824 Cholesterolosis of gallbladder: Secondary | ICD-10-CM | POA: Diagnosis not present

## 2023-09-17 NOTE — Patient Instructions (Addendum)
 You have requested to have your gallbladder removed. This will be done at [facility] with Dr. Desiderio.  You will most likely be out of work 1-2 weeks for this surgery.  If you have FMLA or disability paperwork that needs filled out you may drop this off at our office or this can be faxed to [phone].  You will return after your post-op appointment with a lifting restriction for approximately 4 more weeks.  You will be able to eat anything you would like to following surgery. But, start by eating a bland diet and advance this as tolerated. The Gallbladder diet is below, please go as closely by this diet as possible prior to surgery to avoid any further attacks.  Please see the (blue)pre-care form that you have been given today. Our surgery scheduler will call you to verify surgery date and to go over information.   If you have any questions, please call our office.  Laparoscopic Cholecystectomy Laparoscopic cholecystectomy is surgery to remove the gallbladder. The gallbladder is located in the upper right part of the abdomen, behind the liver. It is a storage sac for bile, which is produced in the liver. Bile aids in the digestion and absorption of fats. Cholecystectomy is often done for inflammation of the gallbladder (cholecystitis). This condition is usually caused by a buildup of gallstones (cholelithiasis) in the gallbladder. Gallstones can block the flow of bile, and that can result in inflammation and pain. In severe cases, emergency surgery may be required. If emergency surgery is not required, you will have time to prepare for the procedure. Laparoscopic surgery is an alternative to open surgery. Laparoscopic surgery has a shorter recovery time. Your common bile duct may also need to be examined during the procedure. If stones are found in the common bile duct, they may be removed. LET [facility] CARE PROVIDER KNOW ABOUT: Any allergies you have. All medicines you are  taking, including vitamins, herbs, eye drops, creams, and over-the-counter medicines. Previous problems you or members of your family have had with the use of anesthetics. Any blood disorders you have. Previous surgeries you have had.  Any medical conditions you have. RISKS AND COMPLICATIONS Generally, this is a safe procedure. However, problems may occur, including: Infection. Bleeding. Allergic reactions to medicines. Damage to other structures or organs. A stone remaining in the common bile duct. A bile leak from the cyst duct that is clipped when your gallbladder is removed. The need to convert to open surgery, which requires a larger incision in the abdomen. This may be necessary if your surgeon thinks that it is not safe to continue with a laparoscopic procedure. BEFORE THE PROCEDURE Ask your health care provider about: Changing or stopping your regular medicines. This is especially important if you are taking diabetes medicines or blood thinners. Taking medicines such as aspirin  and ibuprofen . These medicines can thin your blood. Do not take these medicines before your procedure if your health care provider instructs you not to. Follow instructions from your health care provider about eating or drinking restrictions. Let your health care provider know if you develop a cold or an infection before surgery. Plan to have someone take you home after the procedure. Ask your health care provider how your surgical site will be marked or identified. You may be given antibiotic medicine to help prevent infection. PROCEDURE To reduce your risk of infection: Your health care team will wash or sanitize their hands. Your skin will be washed with soap. An  IV tube may be inserted into one of your veins. You will be given a medicine to make you fall asleep (general anesthetic). A breathing tube will be placed in your mouth. The surgeon will make several small cuts (incisions) in your abdomen. A  thin, lighted tube (laparoscope) that has a tiny camera on the end will be inserted through one of the small incisions. The camera on the laparoscope will send a picture to a TV screen (monitor) in the operating room. This will give the surgeon a good view inside your abdomen. A gas will be pumped into your abdomen. This will expand your abdomen to give the surgeon more room to perform the surgery. Other tools that are needed for the procedure will be inserted through the other incisions. The gallbladder will be removed through one of the incisions. After your gallbladder has been removed, the incisions will be closed with stitches (sutures), staples, or skin glue. Your incisions may be covered with a bandage (dressing). The procedure may vary among health care providers and hospitals. AFTER THE PROCEDURE Your blood pressure, heart rate, breathing rate, and blood oxygen level will be monitored often until the medicines you were given have worn off. You will be given medicines as needed to control your pain.   This information is not intended to replace advice given to you by your health care provider. Make sure you discuss any questions you have with your health care provider.   Document Released: 07/27/2005 Document Revised: 04/17/2015 Document Reviewed: 03/08/2013 Elsevier Interactive Patient Education 2016 [employer]   Low-Fat Diet for Gallbladder Conditions A low-fat diet can be helpful if you have pancreatitis or a gallbladder condition. With these conditions, your pancreas and gallbladder have trouble digesting fats. A healthy eating plan with less fat will help rest your pancreas and gallbladder and reduce your symptoms. WHAT DO I NEED TO KNOW ABOUT THIS DIET? Eat a low-fat diet. Reduce your fat intake to less than 20-30% of your total daily calories. This is less than 50-60 g of fat per day. Remember that you need some fat in your diet. Ask your dietician what your daily goal should  be. Choose nonfat and low-fat healthy foods. Look for the words nonfat, low fat, or fat free. As a guide, look on the label and choose foods with less than 3 g of fat per serving. Eat only one serving. Avoid alcohol . Do not smoke. If you need help quitting, talk with your health care provider. Eat small frequent meals instead of three large heavy meals. WHAT FOODS CAN I EAT? Grains Include healthy grains and starches such as potatoes, wheat bread, fiber-rich cereal, and brown rice. Choose whole grain options whenever possible. In adults, whole grains should account for 45-65% of your daily calories.  Fruits and Vegetables Eat plenty of fruits and vegetables. Fresh fruits and vegetables add fiber to your diet. Meats and Other Protein Sources Eat lean meat such as chicken and pork. Trim any fat off of meat before cooking it. Eggs, fish, and beans are other sources of protein. In adults, these foods should account for 10-35% of your daily calories. Dairy Choose low-fat milk and dairy options. Dairy includes fat and protein, as well as calcium.  Fats and Oils Limit high-fat foods such as fried foods, sweets, baked goods, sugary drinks.  Other Creamy sauces and condiments, such as mayonnaise, can add extra fat. Think about whether or not you need to use them, or use smaller amounts or low fat  options. WHAT FOODS ARE NOT RECOMMENDED? High fat foods, such as: [employer]. Ice cream. French toast. Sweet rolls. Pizza. Cheese bread. Foods covered with batter, butter, creamy sauces, or cheese. Fried foods. Sugary drinks and desserts. Foods that cause gas or bloating   This information is not intended to replace advice given to you by your health care provider. Make sure you discuss any questions you have with your health care provider.   Document Released: 08/01/2013 Document Reviewed: 08/01/2013 Elsevier Interactive Patient Education [employer]
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
## 2023-09-17 NOTE — Progress Notes (Signed)
 Request for medical clearance has been faxed to Dr Godwin Lat.
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
## 2023-09-20 ENCOUNTER — Telehealth: Payer: Self-pay | Admitting: Surgery

## 2023-09-20 NOTE — Telephone Encounter (Signed)
 Patient's wife, Raynelle Callow calls back, they are now informed of all dates regarding surgery.

## 2023-09-20 NOTE — Telephone Encounter (Signed)
 Left message for wife, Sandie to call.  Please inform them of surgery scheduled with Dr. Mauri Sous.  Pre-Admission date/time, and Surgery date at [facility].  Surgery Date: 10/14/23 Preadmission Testing Date: 10/06/23 (phone 8a-1p)  Also patient will need to call at [phone], between 1-3:00pm the day before surgery, to find out what time to arrive for surgery.

## 2023-09-21 NOTE — Progress Notes (Unsigned)
Medical Clearance has been received from Dr Nathanial Rancher. The patient is cleared at Low risk for surgery.

## 2023-10-06 ENCOUNTER — Encounter
Admission: RE | Admit: 2023-10-06 | Discharge: 2023-10-06 | Disposition: A | Discharge status: Home / Self Care | Payer: BC Managed Care – PPO | Source: Ambulatory Visit | Attending: Surgery | Admitting: Surgery

## 2023-10-06 ENCOUNTER — Other Ambulatory Visit: Payer: Self-pay

## 2023-10-06 VITALS — Ht 70.0 in | Wt 240.0 lb

## 2023-10-06 DIAGNOSIS — E119 Type 2 diabetes mellitus without complications: Secondary | ICD-10-CM

## 2023-10-06 NOTE — Patient Instructions (Addendum)
 Your procedure is scheduled on: Thursday March 6  Report to the Registration Desk on the 1st floor of the [facility] To find out your arrival time, please call [phone] between 1PM - 3PM on:  Wednesday March 5  If your arrival time is 6:00 am, do not arrive before that time as the [facility] entrance doors do not open until 6:00 am.  REMEMBER: Instructions that are not followed completely may result in serious medical risk, up to and including death; or upon the discretion of your surgeon and anesthesiologist your surgery may need to be rescheduled.  Do not eat food after midnight the night before surgery.  No gum chewing or hard candies.  You may however, drink CLEAR liquids up to 2 hours before you are scheduled to arrive for your surgery. Do not drink anything within 2 hours of your scheduled arrival time.  Clear liquids include: - water  - apple juice without pulp - gatorade (not RED colors) - black coffee or tea (Do NOT add milk or creamers to the coffee or tea)  One week prior to surgery: Thursday February 27  Stop Anti-inflammatories (NSAIDS) such as Advil, Aleve, Ibuprofen, Motrin, Naproxen, Naprosyn and Aspirin based products such as Excedrin, Goody's Powder, BC Powder. Stop ANY OVER THE COUNTER supplements until after surgery. Cholecalciferol (VITAMIN D3)  magnesium gluconate (MAGONATE)   meloxicam (MOBIC) hold 7 days prior to surgery, last dose Wednesday February 26   You may however, continue to take Tylenol if needed for pain up until the day of surgery.  Continue taking all of your other prescription medications up until the day of surgery.  ON THE DAY OF SURGERY ONLY TAKE THESE MEDICATIONS WITH SIPS OF WATER:  omeprazole (PRILOSEC)    No Alcohol for 24 hours before or after surgery.  No Smoking including e-cigarettes for 24 hours before surgery.  No chewable tobacco products for at least 6 hours before surgery.  No nicotine patches on the day of  surgery.  Do not use any "recreational" drugs for at least a week (preferably 2 weeks) before your surgery.  Please be advised that the combination of cocaine and anesthesia may have negative outcomes, up to and including death. If you test positive for cocaine, your surgery will be cancelled.  On the morning of surgery brush your teeth with toothpaste and water, you may rinse your mouth with mouthwash if you wish. Do not swallow any toothpaste or mouthwash.  Use CHG Soap as directed on instruction sheet.  Do not wear jewelry, make-up, hairpins, clips or nail polish.  For welded (permanent) jewelry: bracelets, anklets, waist bands, etc.  Please have this removed prior to surgery.  If it is not removed, there is a chance that hospital personnel will need to cut it off on the day of surgery.  Do not wear lotions, powders, or perfumes.   Do not shave body hair from the neck down 48 hours before surgery.  Contact lenses, hearing aids and dentures may not be worn into surgery.  Do not bring valuables to the hospital. [facility] is not responsible for any missing/lost belongings or valuables.   Notify your doctor if there is any change in your medical condition (cold, fever, infection).  Wear comfortable clothing (specific to your surgery type) to the hospital.  After surgery, you can help prevent lung complications by doing breathing exercises.  Take deep breaths and cough every 1-2 hours.   When coughing or sneezing, hold a pillow firmly  against your incision with both hands. This is called "splinting." Doing this helps protect your incision. It also decreases belly discomfort.  If you are being discharged the day of surgery, you will not be allowed to drive home. You will need a responsible individual to drive you home and stay with you for 24 hours after surgery.   If you are taking public transportation, you will need to have a responsible individual with you.  Please call the  Pre-admissions Testing Dept. at [phone] if you have any questions about these instructions.  Surgery Visitation Policy:  Patients having surgery or a procedure may have two visitors.  Children under the age of 19 must have an adult with them who is not the patient.  Temporary Visitor Restrictions Due to increasing cases of flu, RSV and COVID-19: Children ages 3 and under will not be able to visit patients in [facility] hospitals under most circumstances.          Preparing for Surgery with CHLORHEXIDINE GLUCONATE (CHG) Soap  Chlorhexidine Gluconate (CHG) Soap  o An antiseptic cleaner that kills germs and bonds with the skin to continue killing germs even after washing  o Used for showering the night before surgery and morning of surgery  Before surgery, you can play an important role by reducing the number of germs on your skin.  CHG (Chlorhexidine gluconate) soap is an antiseptic cleanser which kills germs and bonds with the skin to continue killing germs even after washing.  Please do not use if you have an allergy to CHG or antibacterial soaps. If your skin becomes reddened/irritated stop using the CHG.  1. Shower the NIGHT BEFORE SURGERY and the MORNING OF SURGERY with CHG soap.  2. If you choose to wash your hair, wash your hair first as usual with your normal shampoo.  3. After shampooing, rinse your hair and body thoroughly to remove the shampoo.  4. Use CHG as you would any other liquid soap. You can apply CHG directly to the skin and wash gently with a scrungie or a clean washcloth.  5. Apply the CHG soap to your body only from the neck down. Do not use on open wounds or open sores. Avoid contact with your eyes, ears, mouth, and genitals (private parts). Wash face and genitals (private parts) with your normal soap.  6. Wash thoroughly, paying special attention to the area where your surgery will be performed.  7. Thoroughly rinse your body with warm  water.  8. Do not shower/wash with your normal soap after using and rinsing off the CHG soap.  9. Pat yourself dry with a clean towel.  10. Wear clean pajamas to bed the night before surgery.  12. Place clean sheets on your bed the night of your first shower and do not sleep with pets.  13. Shower again with the CHG soap on the day of surgery prior to arriving at the hospital.  14. Do not apply any deodorants/lotions/powders.  15. Please wear clean clothes to the hospital.

## 2023-10-08 ENCOUNTER — Encounter
Admission: RE | Admit: 2023-10-08 | Discharge: 2023-10-08 | Disposition: A | Discharge status: Home / Self Care | Payer: BC Managed Care – PPO | Source: Ambulatory Visit | Attending: Surgery | Admitting: Surgery

## 2023-10-08 DIAGNOSIS — Z01810 Encounter for preprocedural cardiovascular examination: Secondary | ICD-10-CM

## 2023-10-08 DIAGNOSIS — Z01812 Encounter for preprocedural laboratory examination: Secondary | ICD-10-CM

## 2023-10-08 DIAGNOSIS — E785 Hyperlipidemia, unspecified: Secondary | ICD-10-CM | POA: Diagnosis not present

## 2023-10-08 DIAGNOSIS — Z6835 Body mass index (BMI) 35.0-35.9, adult: Secondary | ICD-10-CM | POA: Diagnosis not present

## 2023-10-08 DIAGNOSIS — K8020 Calculus of gallbladder without cholecystitis without obstruction: Secondary | ICD-10-CM | POA: Insufficient documentation

## 2023-10-08 DIAGNOSIS — R0602 Shortness of breath: Secondary | ICD-10-CM

## 2023-10-08 DIAGNOSIS — Z01818 Encounter for other preprocedural examination: Secondary | ICD-10-CM | POA: Insufficient documentation

## 2023-10-08 DIAGNOSIS — F1020 Alcohol dependence, uncomplicated: Secondary | ICD-10-CM | POA: Insufficient documentation

## 2023-10-08 LAB — CBC
HCT: 43 % (ref 39.0–52.0)
Hemoglobin: 14.8 g/dL (ref 13.0–17.0)
MCH: 31.4 pg (ref 26.0–34.0)
MCHC: 34.4 g/dL (ref 30.0–36.0)
MCV: 91.3 fL (ref 80.0–100.0)
Platelets: 250 10*3/uL (ref 150–400)
RBC: 4.71 MIL/uL (ref 4.22–5.81)
RDW: 12.2 % (ref 11.5–15.5)
WBC: 7.9 10*3/uL (ref 4.0–10.5)
nRBC: 0 % (ref 0.0–0.2)

## 2023-10-08 LAB — COMPREHENSIVE METABOLIC PANEL
ALT: 30 U/L (ref 0–44)
AST: 25 U/L (ref 15–41)
Albumin: 4.4 g/dL (ref 3.5–5.0)
Alkaline Phosphatase: 64 U/L (ref 38–126)
Anion gap: 8 (ref 5–15)
BUN: 18 mg/dL (ref 8–23)
CO2: 26 mmol/L (ref 22–32)
Calcium: 9.2 mg/dL (ref 8.9–10.3)
Chloride: 106 mmol/L (ref 98–111)
Creatinine, Ser: 0.89 mg/dL (ref 0.61–1.24)
GFR, Estimated: >60 mL/min (ref >60)
Glucose, Bld: 120 mg/dL — ABNORMAL HIGH (ref 70–99)
Potassium: 4.3 mmol/L (ref 3.5–5.1)
Sodium: 140 mmol/L (ref 135–145)
Total Bilirubin: 1.1 mg/dL (ref 0.0–1.2)
Total Protein: 7.1 g/dL (ref 6.5–8.1)

## 2023-10-14 ENCOUNTER — Encounter
Admission: RE | Disposition: A | Discharge status: Home / Self Care | Payer: Self-pay | Source: Ambulatory Visit | Attending: Surgery

## 2023-10-14 ENCOUNTER — Ambulatory Visit: Payer: Self-pay | Admitting: Urgent Care

## 2023-10-14 ENCOUNTER — Other Ambulatory Visit: Payer: Self-pay

## 2023-10-14 ENCOUNTER — Ambulatory Visit
Admission: RE | Admit: 2023-10-14 | Discharge: 2023-10-14 | Disposition: A | Discharge status: Home / Self Care | Payer: BC Managed Care – PPO | Source: Ambulatory Visit | Attending: Surgery | Admitting: Surgery

## 2023-10-14 ENCOUNTER — Encounter: Payer: Self-pay | Admitting: Surgery

## 2023-10-14 DIAGNOSIS — K8020 Calculus of gallbladder without cholecystitis without obstruction: Secondary | ICD-10-CM

## 2023-10-14 DIAGNOSIS — E782 Mixed hyperlipidemia: Secondary | ICD-10-CM

## 2023-10-14 DIAGNOSIS — Z87891 Personal history of nicotine dependence: Secondary | ICD-10-CM | POA: Insufficient documentation

## 2023-10-14 DIAGNOSIS — F1020 Alcohol dependence, uncomplicated: Secondary | ICD-10-CM

## 2023-10-14 DIAGNOSIS — Z01812 Encounter for preprocedural laboratory examination: Secondary | ICD-10-CM

## 2023-10-14 DIAGNOSIS — Z01810 Encounter for preprocedural cardiovascular examination: Secondary | ICD-10-CM

## 2023-10-14 DIAGNOSIS — K8010 Calculus of gallbladder with chronic cholecystitis without obstruction: Secondary | ICD-10-CM | POA: Diagnosis not present

## 2023-10-14 DIAGNOSIS — K824 Cholesterolosis of gallbladder: Secondary | ICD-10-CM

## 2023-10-14 DIAGNOSIS — G4730 Sleep apnea, unspecified: Secondary | ICD-10-CM | POA: Diagnosis not present

## 2023-10-14 DIAGNOSIS — K219 Gastro-esophageal reflux disease without esophagitis: Secondary | ICD-10-CM | POA: Diagnosis not present

## 2023-10-14 DIAGNOSIS — R0602 Shortness of breath: Secondary | ICD-10-CM

## 2023-10-14 DIAGNOSIS — E119 Type 2 diabetes mellitus without complications: Secondary | ICD-10-CM

## 2023-10-14 DIAGNOSIS — K811 Chronic cholecystitis: Secondary | ICD-10-CM | POA: Diagnosis not present

## 2023-10-14 DIAGNOSIS — E785 Hyperlipidemia, unspecified: Secondary | ICD-10-CM | POA: Diagnosis not present

## 2023-10-14 HISTORY — PX: ROBOTIC ASSISTED LAPAROSCOPIC LYSIS OF ADHESION: SHX6080

## 2023-10-14 SURGERY — CHOLECYSTECTOMY, ROBOT-ASSISTED, LAPAROSCOPIC
Anesthesia: General | Site: Abdomen

## 2023-10-14 MED ORDER — CHLORHEXIDINE GLUCONATE CLOTH 2 % EX PADS
6.0000 | MEDICATED_PAD | Freq: Once | CUTANEOUS | Status: AC
  Administered 2023-10-14: 6 via TOPICAL

## 2023-10-14 MED ORDER — PROPOFOL 10 MG/ML IV BOLUS
INTRAVENOUS | Status: DC | PRN
  Administered 2023-10-14: 160 mg via INTRAVENOUS

## 2023-10-14 MED ORDER — ACETAMINOPHEN 500 MG PO TABS
1000.0000 mg | ORAL_TABLET | ORAL | Status: AC
  Administered 2023-10-14: 1000 mg via ORAL

## 2023-10-14 MED ORDER — FENTANYL CITRATE (PF) 100 MCG/2ML IJ SOLN
25.0000 ug | INTRAMUSCULAR | Status: DC | PRN
  Administered 2023-10-14 (×2): 25 ug via INTRAVENOUS
  Administered 2023-10-14: 50 ug via INTRAVENOUS

## 2023-10-14 MED ORDER — IBUPROFEN 600 MG PO TABS: 600.0000 mg | ORAL_TABLET | Freq: Three times a day (TID) | ORAL | 1 refills | Status: AC | PRN

## 2023-10-14 MED ORDER — CEFAZOLIN SODIUM-DEXTROSE 2-4 GM/100ML-% IV SOLN
INTRAVENOUS | Status: AC
Start: 2023-10-14 — End: ?
  Filled 2023-10-14: qty 100

## 2023-10-14 MED ORDER — MIDAZOLAM HCL 2 MG/2ML IJ SOLN
INTRAMUSCULAR | Status: AC
  Filled 2023-10-14: qty 2

## 2023-10-14 MED ORDER — SODIUM CHLORIDE 0.9 % IV SOLN: INTRAVENOUS | Status: DC

## 2023-10-14 MED ORDER — FENTANYL CITRATE (PF) 100 MCG/2ML IJ SOLN
INTRAMUSCULAR | Status: AC
  Filled 2023-10-14: qty 2

## 2023-10-14 MED ORDER — GABAPENTIN 300 MG PO CAPS
300.0000 mg | ORAL_CAPSULE | ORAL | Status: AC
  Administered 2023-10-14: 300 mg via ORAL

## 2023-10-14 MED ORDER — EPHEDRINE SULFATE-NACL 50-0.9 MG/10ML-% IV SOSY
PREFILLED_SYRINGE | INTRAVENOUS | Status: DC | PRN
  Administered 2023-10-14 (×2): 5 mg via INTRAVENOUS

## 2023-10-14 MED ORDER — CHLORHEXIDINE GLUCONATE 0.12 % MT SOLN
15.0000 mL | Freq: Once | OROMUCOSAL | Status: AC
  Administered 2023-10-14: 15 mL via OROMUCOSAL

## 2023-10-14 MED ORDER — BUPIVACAINE LIPOSOME 1.3 % IJ SUSP: 10.0000 mL | Freq: Once | INTRAMUSCULAR | Status: DC

## 2023-10-14 MED ORDER — CHLORHEXIDINE GLUCONATE 0.12 % MT SOLN
OROMUCOSAL | Status: AC
  Filled 2023-10-14: qty 15

## 2023-10-14 MED ORDER — BUPIVACAINE-EPINEPHRINE (PF) 0.25% -1:200000 IJ SOLN
INTRAMUSCULAR | Status: AC
  Filled 2023-10-14: qty 30

## 2023-10-14 MED ORDER — ACETAMINOPHEN 500 MG PO TABS
ORAL_TABLET | ORAL | Status: AC
  Filled 2023-10-14: qty 2

## 2023-10-14 MED ORDER — OXYCODONE HCL 5 MG PO TABS: 5.0000 mg | ORAL_TABLET | ORAL | 0 refills | Status: DC | PRN

## 2023-10-14 MED ORDER — ACETAMINOPHEN 500 MG PO TABS: 1000.0000 mg | ORAL_TABLET | Freq: Four times a day (QID) | ORAL | Status: AC | PRN

## 2023-10-14 MED ORDER — PHENYLEPHRINE 80 MCG/ML (10ML) SYRINGE FOR IV PUSH (FOR BLOOD PRESSURE SUPPORT)
PREFILLED_SYRINGE | INTRAVENOUS | Status: DC | PRN
  Administered 2023-10-14: 160 ug via INTRAVENOUS
  Administered 2023-10-14: 80 ug via INTRAVENOUS

## 2023-10-14 MED ORDER — ONDANSETRON HCL 4 MG/2ML IJ SOLN
INTRAMUSCULAR | Status: DC | PRN
Start: 2023-10-14 — End: 2023-10-14
  Administered 2023-10-14: 4 mg via INTRAVENOUS

## 2023-10-14 MED ORDER — MIDAZOLAM HCL 5 MG/5ML IJ SOLN
INTRAMUSCULAR | Status: DC | PRN
  Administered 2023-10-14: 2 mg via INTRAVENOUS

## 2023-10-14 MED ORDER — SODIUM CHLORIDE 0.9 % IR SOLN
Status: DC | PRN
  Administered 2023-10-14: 1000 mL

## 2023-10-14 MED ORDER — 0.9 % SODIUM CHLORIDE (POUR BTL) OPTIME
TOPICAL | Status: DC | PRN
  Administered 2023-10-14: 500 mL

## 2023-10-14 MED ORDER — BUPIVACAINE LIPOSOME 1.3 % IJ SUSP
INTRAMUSCULAR | Status: AC
  Filled 2023-10-14: qty 20

## 2023-10-14 MED ORDER — BUPIVACAINE-EPINEPHRINE (PF) 0.25% -1:200000 IJ SOLN
INTRAMUSCULAR | Status: DC | PRN
  Administered 2023-10-14: 40 mL via SURGICAL_CAVITY

## 2023-10-14 MED ORDER — INDOCYANINE GREEN 25 MG IV SOLR
1.2500 mg | INTRAVENOUS | Status: AC
  Administered 2023-10-14: 1.25 mg via INTRAVENOUS

## 2023-10-14 MED ORDER — OXYCODONE HCL 5 MG PO TABS
ORAL_TABLET | ORAL | Status: AC
  Filled 2023-10-14: qty 1

## 2023-10-14 MED ORDER — CEFAZOLIN SODIUM-DEXTROSE 2-4 GM/100ML-% IV SOLN
2.0000 g | INTRAVENOUS | Status: AC
Start: 2023-10-14 — End: 2023-10-14
  Administered 2023-10-14: 2 g via INTRAVENOUS

## 2023-10-14 MED ORDER — SUGAMMADEX SODIUM 200 MG/2ML IV SOLN
INTRAVENOUS | Status: DC | PRN
  Administered 2023-10-14: 200 mg via INTRAVENOUS

## 2023-10-14 MED ORDER — DEXMEDETOMIDINE HCL IN NACL 80 MCG/20ML IV SOLN
INTRAVENOUS | Status: DC | PRN
  Administered 2023-10-14 (×2): 8 ug via INTRAVENOUS

## 2023-10-14 MED ORDER — DEXAMETHASONE SODIUM PHOSPHATE 10 MG/ML IJ SOLN
INTRAMUSCULAR | Status: DC | PRN
  Administered 2023-10-14: 10 mg via INTRAVENOUS

## 2023-10-14 MED ORDER — KETAMINE HCL 50 MG/5ML IJ SOSY
PREFILLED_SYRINGE | INTRAMUSCULAR | Status: AC
  Filled 2023-10-14: qty 5

## 2023-10-14 MED ORDER — ONDANSETRON HCL 4 MG/2ML IJ SOLN: 4.0000 mg | Freq: Once | INTRAMUSCULAR | Status: DC | PRN

## 2023-10-14 MED ORDER — KETAMINE HCL 10 MG/ML IJ SOLN
INTRAMUSCULAR | Status: DC | PRN
  Administered 2023-10-14 (×2): 20 mg via INTRAVENOUS
  Administered 2023-10-14: 10 mg via INTRAVENOUS

## 2023-10-14 MED ORDER — ACETAMINOPHEN 10 MG/ML IV SOLN: 1000.0000 mg | Freq: Once | INTRAVENOUS | Status: DC | PRN

## 2023-10-14 MED ORDER — BUPIVACAINE LIPOSOME 1.3 % IJ SUSP
INTRAMUSCULAR | Status: AC
  Filled 2023-10-14: qty 10

## 2023-10-14 MED ORDER — LIDOCAINE HCL (CARDIAC) PF 100 MG/5ML IV SOSY
PREFILLED_SYRINGE | INTRAVENOUS | Status: DC | PRN
  Administered 2023-10-14: 100 mg via INTRAVENOUS

## 2023-10-14 MED ORDER — ROCURONIUM BROMIDE 100 MG/10ML IV SOLN
INTRAVENOUS | Status: DC | PRN
  Administered 2023-10-14 (×2): 20 mg via INTRAVENOUS
  Administered 2023-10-14: 50 mg via INTRAVENOUS

## 2023-10-14 MED ORDER — OXYCODONE HCL 5 MG/5ML PO SOLN: 5.0000 mg | Freq: Once | ORAL | Status: AC | PRN

## 2023-10-14 MED ORDER — GLYCOPYRROLATE 0.2 MG/ML IJ SOLN
INTRAMUSCULAR | Status: DC | PRN
  Administered 2023-10-14: .2 mg via INTRAVENOUS

## 2023-10-14 MED ORDER — GABAPENTIN 300 MG PO CAPS
ORAL_CAPSULE | ORAL | Status: AC
  Filled 2023-10-14: qty 1

## 2023-10-14 MED ORDER — LACTATED RINGERS IV SOLN: INTRAVENOUS | Status: DC

## 2023-10-14 MED ORDER — FENTANYL CITRATE (PF) 100 MCG/2ML IJ SOLN
INTRAMUSCULAR | Status: DC | PRN
  Administered 2023-10-14 (×2): 50 ug via INTRAVENOUS

## 2023-10-14 MED ORDER — ORAL CARE MOUTH RINSE: 15.0000 mL | Freq: Once | OROMUCOSAL | Status: AC

## 2023-10-14 MED ORDER — OXYCODONE HCL 5 MG PO TABS
5.0000 mg | ORAL_TABLET | Freq: Once | ORAL | Status: AC | PRN
  Administered 2023-10-14: 5 mg via ORAL

## 2023-10-14 SURGICAL SUPPLY — 42 items
BAG PRESSURE INF REUSE 1000 (BAG) IMPLANT
CANNULA CAP OBTURATR AIRSEAL 8 (CAP) IMPLANT
CAUTERY HOOK MNPLR 1.6 DVNC XI (INSTRUMENTS) ×3 IMPLANT
CLIP LIGATING HEMO O LOK GREEN (MISCELLANEOUS) ×3 IMPLANT
DERMABOND ADVANCED .7 DNX12 (GAUZE/BANDAGES/DRESSINGS) ×3 IMPLANT
DRAPE ARM DVNC X/XI (DISPOSABLE) ×12 IMPLANT
DRAPE COLUMN DVNC XI (DISPOSABLE) ×3 IMPLANT
ELECT CAUTERY BLADE TIP 2.5 (TIP) ×3 IMPLANT
ELECT REM PT RETURN 9FT ADLT (ELECTROSURGICAL) ×3 IMPLANT
ELECTRODE CAUTERY BLDE TIP 2.5 (TIP) ×3 IMPLANT
ELECTRODE REM PT RTRN 9FT ADLT (ELECTROSURGICAL) ×3 IMPLANT
FORCEPS BPLR R/ABLATION 8 DVNC (INSTRUMENTS) ×3 IMPLANT
FORCEPS PROGRASP DVNC XI (FORCEP) ×3 IMPLANT
GLOVE SURG SYN 7.0 (GLOVE) ×6 IMPLANT
GLOVE SURG SYN 7.0 PF PI (GLOVE) ×6 IMPLANT
GLOVE SURG SYN 7.5 E (GLOVE) ×6 IMPLANT
GLOVE SURG SYN 7.5 PF PI (GLOVE) ×6 IMPLANT
GOWN STRL REUS W/ TWL LRG LVL3 (GOWN DISPOSABLE) ×12 IMPLANT
IRRIGATOR SUCT 8 DISP DVNC XI (IRRIGATION / IRRIGATOR) IMPLANT
IV NS 1000ML BAXH (IV SOLUTION) IMPLANT
KIT PINK PAD W/HEAD ARE REST (MISCELLANEOUS) ×3 IMPLANT
KIT PINK PAD W/HEAD ARM REST (MISCELLANEOUS) ×3 IMPLANT
LABEL OR SOLS (LABEL) ×3 IMPLANT
MANIFOLD NEPTUNE II (INSTRUMENTS) ×3 IMPLANT
NDL HYPO 22X1.5 SAFETY MO (MISCELLANEOUS) ×3 IMPLANT
NEEDLE HYPO 22X1.5 SAFETY MO (MISCELLANEOUS) ×3 IMPLANT
NS IRRIG 500ML POUR BTL (IV SOLUTION) ×3 IMPLANT
OBTURATOR OPTICAL STND 8 DVNC (TROCAR) ×3 IMPLANT
OBTURATOR OPTICALSTD 8 DVNC (TROCAR) ×3 IMPLANT
PACK LAP CHOLECYSTECTOMY (MISCELLANEOUS) ×3 IMPLANT
SEAL UNIV 5-12 XI (MISCELLANEOUS) ×12 IMPLANT
SET TUBE FILTERED XL AIRSEAL (SET/KITS/TRAYS/PACK) IMPLANT
SET TUBE SMOKE EVAC HIGH FLOW (TUBING) ×3 IMPLANT
SOL ELECTROSURG ANTI STICK (MISCELLANEOUS) ×3 IMPLANT
SOLUTION ELECTROSURG ANTI STCK (MISCELLANEOUS) ×3 IMPLANT
SPIKE FLUID TRANSFER (MISCELLANEOUS) ×3 IMPLANT
SUT MNCRL AB 4-0 PS2 18 (SUTURE) ×3 IMPLANT
SUT VIC AB 3-0 SH 27X BRD (SUTURE) IMPLANT
SUT VICRYL 0 UR6 27IN ABS (SUTURE) ×6 IMPLANT
SYS BAG RETRIEVAL 10MM (BASKET) ×3 IMPLANT
SYSTEM BAG RETRIEVAL 10MM (BASKET) ×3 IMPLANT
WATER STERILE IRR 500ML POUR (IV SOLUTION) ×3 IMPLANT

## 2023-10-14 NOTE — Discharge Instructions (Addendum)
 Discharge Instructions: 1.  Patient may shower, but do not scrub wounds heavily and dab dry only. 2.  Do not submerge wounds in pool/tub until fully healed. 3.  Do not apply ointments or hydrogen peroxide to the wounds. 4.  May apply ice packs to the wounds for comfort. 5.  Do not take Meloxicam or naproxen while taking Ibuprofen for pain control. 6.  Do not drive while taking narcotics for pain control.  Prior to driving, make sure you are able to rotate right and left to look at blindspots without significant pain or discomfort. 7.  No heavy lifting or pushing of more than 10-15 lbs for 4 weeks.   Information for Discharge Teaching:  DO NOT REMOVE TEAL EXPAREL BRACELET FOR 4 DAYS, (96 hours), 10/18/2023 EXPAREL (bupivacaine liposome injectable suspension)   Pain relief is important to your recovery. The goal is to control your pain so you can move easier and return to your normal activities as soon as possible after your procedure. Your physician may use several types of medicines to manage pain, swelling, and more.  Your surgeon or anesthesiologist gave you EXPAREL(bupivacaine) to help control your pain after surgery.  EXPAREL is a local anesthetic designed to release slowly over an extended period of time to provide pain relief by numbing the tissue around the surgical site. EXPAREL is designed to release pain medication over time and can control pain for up to 72 hours. Depending on how you respond to EXPAREL, you may require less pain medication during your recovery. EXPAREL can help reduce or eliminate the need for opioids during the first few days after surgery when pain relief is needed the most. EXPAREL is not an opioid and is not addictive. It does not cause sleepiness or sedation.   Important! A teal colored band has been placed on your arm with the date, time and amount of EXPAREL you have received. Please leave this armband in place for the full 96 hours following administration,  and then you may remove the band. If you return to the hospital for any reason within 96 hours following the administration of EXPAREL, the armband provides important information that your health care providers to know, and alerts them that you have received this anesthetic.    Possible side effects of EXPAREL: Temporary loss of sensation or ability to move in the area where medication was injected. Nausea, vomiting, constipation Rarely, numbness and tingling in your mouth or lips, lightheadedness, or anxiety may occur. Call your doctor right away if you think you may be experiencing any of these sensations, or if you have other questions regarding possible side effects.  Follow all other discharge instructions given to you by your surgeon or nurse. Eat a healthy diet and drink plenty of water or other fluids.

## 2023-10-14 NOTE — Interval H&P Note (Signed)
 History and Physical Interval Note:  10/14/2023 1:56 PM  [name] [name]  has presented today for surgery, with the diagnosis of gallbladder polyps.  The various methods of treatment have been discussed with the patient and family. After consideration of risks, benefits and other options for treatment, the patient has consented to  Procedure(s): CHOLECYSTECTOMY, ROBOT-ASSISTED, LAPAROSCOPIC (N/A) INDOCYANINE GREEN FLUORESCENCE IMAGING (ICG) (N/A) as a surgical intervention.  The patient's history has been reviewed, patient examined, no change in status, stable for surgery.  I have reviewed the patient's chart and labs.  Questions were answered to the patient's satisfaction.     [name] [name]
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
## 2023-10-14 NOTE — Transfer of Care (Signed)
 Immediate Anesthesia Transfer of Care Note  Patient: [name] [name]  Procedure(s) Performed: CHOLECYSTECTOMY, ROBOT-ASSISTED, LAPAROSCOPIC (Abdomen) INDOCYANINE GREEN FLUORESCENCE IMAGING (ICG) ROBOTIC ASSISTED LAPAROSCOPIC LYSIS OF ADHESION (Abdomen)  Patient Location: PACU  Anesthesia Type:General  Level of Consciousness: drowsy  Airway & Oxygen Therapy: Patient Spontanous Breathing and Patient connected to face mask oxygen  Post-op Assessment: Report given to RN  Post vital signs: stable  Last Vitals:  Vitals Value Taken Time  BP 127/72 10/14/23 1615  Temp 36.4 C 10/14/23 1615  Pulse 77 10/14/23 1617  Resp 16 10/14/23 1617  SpO2 99 % 10/14/23 1617  Vitals shown include unfiled device data.  Last Pain:  Vitals:   10/14/23 1136  TempSrc: Temporal  PainSc: 0-No pain      Patients Stated Pain Goal: 0 (10/14/23 1136)  Complications: No notable events documented.

## 2023-10-14 NOTE — Op Note (Signed)
  Procedure Date:  10/14/2023  Pre-operative Diagnosis:  Gallbladder polyps  Post-operative Diagnosis: Gallbladder polyps  Procedure:  Robotic assisted cholecystectomy with ICG FireFly cholangiogram; extensive lysis of adhesions  Surgeon:  Howie Ill, MD  Anesthesia:  General endotracheal  Estimated Blood Loss:  20 ml  Specimens:  gallbladder  Complications:  None  Findings:  The patient had extensive adhesions of the omentum to the anterior abdominal wall from his prior exploratory laparotomy and subsequent ventral hernia repair.    Indications for Procedure:  This is a 68 y.o. male who presents with enlarging gallbladder polyps.  The benefits, complications, treatment options, and expected outcomes were discussed with the patient. The risks of bleeding, infection, recurrence of symptoms, failure to resolve symptoms, bile duct damage, bile duct leak, bowel injury, and need for further procedures were all discussed with the patient and he was willing to proceed.  Description of Procedure: The patient was correctly identified in the preoperative area and brought into the operating room.  The patient was placed supine with VTE prophylaxis in place.  Appropriate time-outs were performed.  Anesthesia was induced and the patient was intubated.  Appropriate antibiotics were infused.  The abdomen was prepped and draped in a sterile fashion. An infraumbilical incision was made. A cutdown technique was used to enter the abdominal cavity without injury, and a 12 mm robotic port was inserted.  Pneumoperitoneum was obtained with appropriate opening pressures.  Three 8-mm ports were placed in the mid abdomen at the level of the umbilicus under direct visualization.  The DaVinci platform was docked, camera targeted, and instruments were placed under direct visualization.  The patient had extensive adhesions of the omentum to the anterior abdominal wall from his prior surgeries.  These were fairly  dense and required combination of cautery and blunt dissection to free all the adhesions.  This allowed final appropriate positioning of our left lateral instrument.  The gallbladder was identified.  The fundus was grasped and retracted cephalad.  Adhesions were lysed bluntly and with electrocautery. The infundibulum was grasped and retracted laterally, exposing the peritoneum overlying the gallbladder.  This was incised with electrocautery and extended on either side of the gallbladder.  FireFly cholangiogram was then obtained, and we were able to clearly identify the cystic duct and common bile duct.  The cystic duct and cystic artery were carefully dissected with combination of cautery and blunt dissection.  Both were clipped twice proximally and once distally, cutting in between.  The gallbladder was taken from the gallbladder fossa in a retrograde fashion with electrocautery. The gallbladder was placed in an Endocatch bag. The liver bed was inspected and any bleeding was controlled with electrocautery. The right upper quadrant was then inspected again revealing intact clips, no bleeding, and no ductal injury.  The area was thoroughly irrigated.  The 8 mm ports were removed under direct visualization and the 12 mm port was removed.  The Endocatch bag was brought out via the umbilical incision. The fascial opening was closed using 0 vicryl suture.  Local anesthetic was infused in all incisions and the incisions were closed with 4-0 Monocryl.  The wounds were cleaned and sealed with DermaBond.  The patient was emerged from anesthesia and extubated and brought to the recovery room for further management.  The patient tolerated the procedure well and all counts were correct at the end of the case.   Howie Ill, MD

## 2023-10-14 NOTE — Anesthesia Procedure Notes (Signed)
 Procedure Name: Intubation Date/Time: 10/14/2023 2:12 PM  Performed by: Cheral Bay, CRNAPre-anesthesia Checklist: Patient identified, Emergency Drugs available, Suction available and Patient being monitored Patient Re-evaluated:Patient Re-evaluated prior to induction Oxygen Delivery Method: Circle system utilized Preoxygenation: Pre-oxygenation with 100% oxygen Induction Type: IV induction Ventilation: Mask ventilation without difficulty Laryngoscope Size: McGrath and 4 Grade View: Grade I Tube type: Oral Tube size: 7.0 mm Number of attempts: 1 Airway Equipment and Method: Stylet and Oral airway Placement Confirmation: positive ETCO2 and breath sounds checked- equal and bilateral Secured at: 22 cm Tube secured with: Tape Dental Injury: Teeth and Oropharynx as per pre-operative assessment

## 2023-10-15 ENCOUNTER — Encounter: Payer: Self-pay | Admitting: Surgery

## 2023-10-15 NOTE — Anesthesia Postprocedure Evaluation (Signed)
 Anesthesia Post Note  Patient: Matson Welch  Procedure(s) Performed: CHOLECYSTECTOMY, ROBOT-ASSISTED, LAPAROSCOPIC (Abdomen) INDOCYANINE GREEN FLUORESCENCE IMAGING (ICG) ROBOTIC ASSISTED LAPAROSCOPIC LYSIS OF ADHESION (Abdomen)  Patient location during evaluation: PACU Anesthesia Type: General Level of consciousness: awake and alert Pain management: pain level controlled Vital Signs Assessment: post-procedure vital signs reviewed and stable Respiratory status: spontaneous breathing, nonlabored ventilation, respiratory function stable and patient connected to nasal cannula oxygen Cardiovascular status: blood pressure returned to baseline and stable Postop Assessment: no apparent nausea or vomiting Anesthetic complications: no   No notable events documented.   Last Vitals:  Vitals:   10/14/23 1700 10/14/23 1709  BP: (!) 146/82 135/78  Pulse: (!) 55 80  Resp: 15 16  Temp: 36.4 C 36.4 C  SpO2: 90% 95%    Last Pain:  Vitals:   10/14/23 1709  TempSrc: Temporal  PainSc: 3                  Cleda Mccreedy Elbia Paro

## 2023-10-18 LAB — SURGICAL PATHOLOGY

## 2023-10-19 DIAGNOSIS — G4733 Obstructive sleep apnea (adult) (pediatric): Secondary | ICD-10-CM | POA: Diagnosis not present

## 2023-10-27 ENCOUNTER — Encounter: Payer: Self-pay | Admitting: Physician Assistant

## 2023-10-27 ENCOUNTER — Ambulatory Visit (INDEPENDENT_AMBULATORY_CARE_PROVIDER_SITE_OTHER): Admitting: Physician Assistant

## 2023-10-27 VITALS — BP 117/75 | HR 79 | Temp 98.0°F | Ht 69.0 in | Wt 240.0 lb

## 2023-10-27 DIAGNOSIS — K824 Cholesterolosis of gallbladder: Secondary | ICD-10-CM

## 2023-10-27 DIAGNOSIS — Z09 Encounter for follow-up examination after completed treatment for conditions other than malignant neoplasm: Secondary | ICD-10-CM

## 2023-10-27 NOTE — Progress Notes (Signed)
 [facility] POST-OP OFFICE VISIT  10/27/2023  HPI: [name] [name] is a 68 y.o. male 13 days s/p robotic assisted laparoscopic cholecystectomy and LOA for gallbladder polyps with Dr Aleen Campi   He has done very well; thankful Minimal abdominal soreness No fever, chills, nausea, emesis He is tolerating PO; no diarrhea Incisions are well healed Ambulating well No other complaints   Vital signs: BP 117/75   Pulse 79   Temp 98 F (36.7 C) (Oral)   Ht 5\' 9"  (1.753 m)   Wt 240 lb (108.9 kg)   SpO2 95%   BMI 35.44 kg/m    Physical Exam: Constitutional: Well appearing male, NAD Abdomen: Soft, non-tender, non-distended, no rebound/guarding Skin: Laparoscopic incisions are healing well, no erythema or drainage   Assessment/Plan: This is a 68 y.o. male 13 days s/p robotic assisted laparoscopic cholecystectomy and LOA for gallbladder polyps with Dr Aleen Campi    - Pain control prn  - Reviewed wound care recommendation  - Reviewed lifting restrictions; 4 weeks total  - Reviewed surgical pathology; CCC  - He can follow up on as needed basis; He understands to call with questions/concerns  -- Lynden Oxford, PA-C [facility] 10/27/2023, 9:37 AM M-F: 7am - 4pm

## 2023-10-27 NOTE — Patient Instructions (Signed)

## 2024-01-07 DIAGNOSIS — Z79899 Other long term (current) drug therapy: Secondary | ICD-10-CM | POA: Diagnosis not present

## 2024-01-07 DIAGNOSIS — K219 Gastro-esophageal reflux disease without esophagitis: Secondary | ICD-10-CM | POA: Diagnosis not present

## 2024-01-07 DIAGNOSIS — E782 Mixed hyperlipidemia: Secondary | ICD-10-CM | POA: Diagnosis not present

## 2024-01-07 DIAGNOSIS — M109 Gout, unspecified: Secondary | ICD-10-CM | POA: Diagnosis not present

## 2024-01-07 DIAGNOSIS — R7989 Other specified abnormal findings of blood chemistry: Secondary | ICD-10-CM | POA: Diagnosis not present

## 2024-01-07 DIAGNOSIS — E1169 Type 2 diabetes mellitus with other specified complication: Secondary | ICD-10-CM | POA: Diagnosis not present

## 2024-01-18 DIAGNOSIS — H2513 Age-related nuclear cataract, bilateral: Secondary | ICD-10-CM | POA: Diagnosis not present

## 2024-01-18 DIAGNOSIS — H5203 Hypermetropia, bilateral: Secondary | ICD-10-CM | POA: Diagnosis not present

## 2024-01-18 DIAGNOSIS — H52223 Regular astigmatism, bilateral: Secondary | ICD-10-CM | POA: Diagnosis not present

## 2024-01-18 DIAGNOSIS — H524 Presbyopia: Secondary | ICD-10-CM | POA: Diagnosis not present

## 2024-01-19 DIAGNOSIS — G4733 Obstructive sleep apnea (adult) (pediatric): Secondary | ICD-10-CM | POA: Diagnosis not present

## 2024-03-02 ENCOUNTER — Other Ambulatory Visit: Payer: Self-pay | Admitting: Surgery

## 2024-04-20 DIAGNOSIS — G4733 Obstructive sleep apnea (adult) (pediatric): Secondary | ICD-10-CM | POA: Diagnosis not present

## 2024-06-16 DIAGNOSIS — J343 Hypertrophy of nasal turbinates: Secondary | ICD-10-CM | POA: Diagnosis not present

## 2024-06-16 DIAGNOSIS — G4733 Obstructive sleep apnea (adult) (pediatric): Secondary | ICD-10-CM | POA: Diagnosis not present

## 2024-06-16 DIAGNOSIS — H9313 Tinnitus, bilateral: Secondary | ICD-10-CM | POA: Diagnosis not present

## 2024-06-16 DIAGNOSIS — H6123 Impacted cerumen, bilateral: Secondary | ICD-10-CM | POA: Diagnosis not present

## 2024-07-18 DIAGNOSIS — H2513 Age-related nuclear cataract, bilateral: Secondary | ICD-10-CM | POA: Diagnosis not present

## 2024-07-19 DIAGNOSIS — H903 Sensorineural hearing loss, bilateral: Secondary | ICD-10-CM | POA: Diagnosis not present

## 2024-07-20 DIAGNOSIS — G4733 Obstructive sleep apnea (adult) (pediatric): Secondary | ICD-10-CM | POA: Diagnosis not present

## 2024-07-21 DIAGNOSIS — E1169 Type 2 diabetes mellitus with other specified complication: Secondary | ICD-10-CM | POA: Diagnosis not present

## 2024-07-21 DIAGNOSIS — K219 Gastro-esophageal reflux disease without esophagitis: Secondary | ICD-10-CM | POA: Diagnosis not present

## 2024-07-21 DIAGNOSIS — Z79899 Other long term (current) drug therapy: Secondary | ICD-10-CM | POA: Diagnosis not present

## 2024-07-21 DIAGNOSIS — E782 Mixed hyperlipidemia: Secondary | ICD-10-CM | POA: Diagnosis not present

## 2024-07-21 DIAGNOSIS — M109 Gout, unspecified: Secondary | ICD-10-CM | POA: Diagnosis not present

## 2024-07-21 DIAGNOSIS — R7989 Other specified abnormal findings of blood chemistry: Secondary | ICD-10-CM | POA: Diagnosis not present
